# Patient Record
Sex: Female | Born: 1960 | Race: White | Hispanic: No | Marital: Single | Smoking: Former smoker
Health system: Southern US, Community
[De-identification: ages and names within clinical notes are randomized; demographics above are authoritative.]

## PROBLEM LIST (undated history)

## (undated) DIAGNOSIS — F32A Depression, unspecified: Secondary | ICD-10-CM

## (undated) DIAGNOSIS — D473 Essential (hemorrhagic) thrombocythemia: Secondary | ICD-10-CM

## (undated) DIAGNOSIS — N8003 Adenomyosis of the uterus: Secondary | ICD-10-CM

## (undated) DIAGNOSIS — I10 Essential (primary) hypertension: Secondary | ICD-10-CM

## (undated) DIAGNOSIS — G4730 Sleep apnea, unspecified: Secondary | ICD-10-CM

## (undated) DIAGNOSIS — N809 Endometriosis, unspecified: Secondary | ICD-10-CM

## (undated) DIAGNOSIS — N800 Endometriosis of uterus: Secondary | ICD-10-CM

## (undated) DIAGNOSIS — D219 Benign neoplasm of connective and other soft tissue, unspecified: Secondary | ICD-10-CM

## (undated) DIAGNOSIS — F329 Major depressive disorder, single episode, unspecified: Secondary | ICD-10-CM

## (undated) DIAGNOSIS — D75839 Thrombocytosis, unspecified: Secondary | ICD-10-CM

## (undated) HISTORY — DX: Benign neoplasm of connective and other soft tissue, unspecified: D21.9

## (undated) HISTORY — DX: Endometriosis of uterus: N80.0

## (undated) HISTORY — DX: Essential (hemorrhagic) thrombocythemia: D47.3

## (undated) HISTORY — DX: Essential (primary) hypertension: I10

## (undated) HISTORY — PX: INNER EAR SURGERY: SHX679

## (undated) HISTORY — DX: Endometriosis, unspecified: N80.9

## (undated) HISTORY — DX: Major depressive disorder, single episode, unspecified: F32.9

## (undated) HISTORY — DX: Sleep apnea, unspecified: G47.30

## (undated) HISTORY — DX: Thrombocytosis, unspecified: D75.839

## (undated) HISTORY — PX: BUNIONECTOMY: SHX129

## (undated) HISTORY — DX: Adenomyosis of the uterus: N80.03

## (undated) HISTORY — DX: Depression, unspecified: F32.A

---

## 2005-10-27 ENCOUNTER — Other Ambulatory Visit: Admission: RE | Admit: 2005-10-27 | Discharge: 2005-10-27 | Payer: Self-pay | Admitting: Obstetrics & Gynecology

## 2005-10-31 ENCOUNTER — Ambulatory Visit (HOSPITAL_COMMUNITY): Admission: RE | Admit: 2005-10-31 | Discharge: 2005-10-31 | Payer: Self-pay | Admitting: Obstetrics & Gynecology

## 2005-11-24 ENCOUNTER — Encounter: Admission: RE | Admit: 2005-11-24 | Discharge: 2005-11-24 | Payer: Self-pay | Admitting: Obstetrics & Gynecology

## 2006-01-20 HISTORY — PX: TOTAL ABDOMINAL HYSTERECTOMY: SHX209

## 2006-02-05 ENCOUNTER — Inpatient Hospital Stay (HOSPITAL_COMMUNITY): Admission: AD | Admit: 2006-02-05 | Discharge: 2006-02-07 | Payer: Self-pay | Admitting: Obstetrics & Gynecology

## 2006-02-05 ENCOUNTER — Encounter (INDEPENDENT_AMBULATORY_CARE_PROVIDER_SITE_OTHER): Payer: Self-pay | Admitting: *Deleted

## 2006-11-27 ENCOUNTER — Encounter: Admission: RE | Admit: 2006-11-27 | Discharge: 2006-11-27 | Payer: Self-pay | Admitting: Obstetrics & Gynecology

## 2008-01-01 ENCOUNTER — Encounter: Admission: RE | Admit: 2008-01-01 | Discharge: 2008-01-01 | Payer: Self-pay | Admitting: Obstetrics & Gynecology

## 2009-01-07 ENCOUNTER — Encounter: Admission: RE | Admit: 2009-01-07 | Discharge: 2009-01-07 | Payer: Self-pay | Admitting: Obstetrics & Gynecology

## 2010-03-24 ENCOUNTER — Encounter: Admission: RE | Admit: 2010-03-24 | Discharge: 2010-03-24 | Payer: Self-pay | Admitting: Obstetrics & Gynecology

## 2010-04-06 ENCOUNTER — Encounter: Admission: RE | Admit: 2010-04-06 | Discharge: 2010-04-06 | Payer: Self-pay | Admitting: Obstetrics & Gynecology

## 2010-10-07 NOTE — Discharge Summary (Signed)
Leah Sullivan, Leah Sullivan              ACCOUNT NO.:  [id number]   MEDICAL RECORD NO.:  [id number]          PATIENT TYPE:  INP   LOCATION:  9318                          FACILITY:  [facility]   PHYSICIAN:  M. Leda Quail, MD  DATE OF BIRTH:  10/11/1960   DATE OF ADMISSION:  02/05/2006  DATE OF DISCHARGE:  02/07/2006                                 DISCHARGE SUMMARY   ADMISSION DIAGNOSES:  42. A 50 year old G0 single white female with menorrhagia.  2. Adenomyosis versus fibroid uterus on ultrasound.  3. Endometrioma.  4. Possible borderline hypertension.   DISCHARGE DIAGNOSES:  1. Extensive pelvic endometriosis.  2. Probable adenomyosis.  3. Bilateral ovarian endometriomas.   PROCEDURE:  Total abdominal hysterectomy, bilateral salpingo-oophorectomy,  and extensive lysis of dense adhesions.   HOSPITAL COURSE:  The written H&P is in the chart.  In brief, Leah Sullivan  is a very pleasant 50 year old G0 single white female who has a history of  menorrhagia and on ultrasound had an enlarged uterus with a fibroid or  adenomyosis, but noted she had probable bilateral endometriomas as well.  The patient opted for definitive treatment and was admitted to same-day  surgery, where she underwent a TAH-BSO.  It was a very difficult  hysterectomy requiring approximately 4-1/2 hours due to the extensive nature  of the endometriosis.  However, during surgery the patient did do well.  She  made about 500 mL of clear urine.  She lost about 1000 mL of blood.  Her  starting hemoglobin was 13.4.  The patient after surgery was taken to the  recovery room and from there to the 2A unit.  She had a Dilaudid PCA and an  On-Q lidocaine pump in the incision for pain control.  On the evening of  postoperative day #0, she was still very groggy from her anesthesia but  arousable and coherent.  She was in good pain control.  Her vital signs were  stable.  She did have a little temperature of 99.2; however, she is a  smoker  and pulmonary PT was begun p.r.n.  The patient on the morning of  postoperative day #1 was much better.  Her vital signs were stable.  She had  no temperatures.  Her initial postoperative hemoglobin was 9.7 and her  electrolytes were stable.  She had made 2500 mL of urine output.  She had  good pain control and was tolerating liquids.  Her abdomen had good bowel  sounds.  The incision was clean, dry, and intact.  She did have an  intraperitoneal JP that was placed during the procedure.  The JP had about  80 mL the first day.  It was serosanguineous.  On her first day after  surgery she was able to be advanced to a regular diet and needed oral pain  medicines.  Her IV was discontinued.  She was up to ambulate in the hall and  she was able to void without difficulty.  By postop day #2, she had  excellent pain control.  She was voiding well and eating and ambulating  without difficulty.  Vital  signs were stable.  Postoperative day #2  hemoglobin was 9.5, which is stable.  She had about 40 mL of serosanguineous  drainage in her JP over the previous 12 hours.  Otherwise, her exam was  benign.  She had excellent bowel sounds.  Her incision again looked clean,  dry, and intact.  Her JP was removed without difficulty and the On-Q pump  tubing was removed without difficulty.  These incisions were dressed.  At  this point the patient had met all criteria for discharge and she will be  discharged to home later with her sister.   DISCHARGE INSTRUCTIONS:  These were provided in written and verbal form, and  the patient has a postop appointment to see me next week for an incision  check.  She is specifically told to call if she has worsening pain, nausea,  drainage from her incision or any fevers.  The patient is sent home with  pain medications for Percocet and Motrin.      Leah Keas, MD  Electronically Signed     MSM/MEDQ  D:  02/07/2006  T:  02/08/2006  Job:  [id number]

## 2010-10-07 NOTE — Op Note (Signed)
NAMEVENNA, Leah Sullivan              ACCOUNT NO.:  [id number]   MEDICAL RECORD NO.:  [id number]          PATIENT TYPE:  INP   LOCATION:  9318                          FACILITY:  [facility]   PHYSICIAN:  M. Leda Quail, MD  DATE OF BIRTH:  05/10/1961   DATE OF PROCEDURE:  02/05/2006  DATE OF DISCHARGE:                                 OPERATIVE REPORT   PREOPERATIVE DIAGNOSES:  57. A 49 year old gravida 0 single white female with menorrhagia.  2. Adenomyosis versus 5-cm fibroid uterus on ultrasound.  3. Left probable endometrioma.  4. Cervical mass consistent with probable prolapsed polyp.   POSTOPERATIVE DIAGNOSES:  29. A 50 year old gravida 0 single white female with an enlarged globular      uterus consistent with adenomyosis.  2. Extensive endometriosis.  3. Bilateral endometriomas on the ovaries.  4. Dense adhesions of the colon to the posterior cul-de-sac.   PROCEDURE:  TAH/BSO.  The surgery took approximately 4-and-a-half hours.   SURGEON:  M. Leda Quail, M.D.   ASSISTANT:  Edwena Felty. Romine, M.D.   ANESTHESIA:  General endotracheal.   SPECIMENS:  Uterus, cervix, ovaries, and tubes to pathology.   ESTIMATED BLOOD LOSS:  1000 mL   URINE OUTPUT:  500 mL of clear urine   FLUIDS:  3200 mL of LR   COMPLICATIONS:  None.   INDICATIONS:  A 50 year old G0 single white female with severe menorrhagia  who has undergone workup including ultrasound which showed a 5-cm region of  adenomyosis versus a poorly-defined fibroid in the fundus of the uterus.  The patient appears on ultrasound to also have bilateral endometriomas and  has been counseled therefore that she probably has endometriosis as well.  The patient does not complain of extensive pain; however, she does have  significant pain with menstrual cycles and family members feel like she has  had pain for a long time, she has just lived with it so long that she does  not even realize that she had pain.  Because of the  menorrhagia and the  cervical mass the patient was counseled about either proceeding with an exam  under anesthesia with excision of cervical mass and a hysteroscopy with D&C  or proceeding with definitive treatment.  The patient has considered options  and decided to go ahead and proceed with a TAH and possible BSO.  The  patient understood before the case started that if she had endometriosis  present on both ovaries that they would both be removed and she would be  surgically menopausal at this point.   PROCEDURE:  The patient was taken to the operating room.  She was placed in  the supine position, general endotracheal anesthesia was administered by the  anesthesia staff without difficulty.  Her legs were positioned in the frog-  leg position and an exam under anesthesia was performed.  A very smooth  cervix was palpated with an approximately 2.5-cm lesion that was felt to be  coming through the cervical os.  This did feel like a prolapsed polyp.  The  abdomen, perineum, inner thighs and vagina were prepped in a  normal sterile  fashion.  A Foley catheter was inserted under sterile conditions, the legs  were straightened, then the abdomen was draped in a normal sterile fashion.   Using a knife, a Pfannenstiel skin incision was made and carried down  through subcutaneous fat and tissue.  Cautery was used for hemostasis.  The  fascia was identified, nicked in the midline, the fascial incision was  extended laterally.  Kocher clamps were applied to the superior aspect of  the fascial incision and underlying rectus muscles were dissected off  sharply.  In a similar fashion, Kocher clamps were applied to the inferior  aspect of the fascial incision and the underlying rectus muscles were  dissected off sharply.  The rectus muscles were divided in the midline and  the peritoneum was entered superiorly.  The peritoneal incision was incised  superiorly and inferiorly with care taken to ensure  the location of the  bladder.   O'Connor-O'Sullivan retractor was placed intraabdominally.  The pelvis was  surveyed.  The uterus was densely adhesive to the posterior cul-de-sac and  did not lift up very well.  The right and left ovaries were adhered to the  pelvic side wall and to the uterus.  There did not appear to be any  endometriosis anteriorly on the uterus. Survey of the upper abdomen was  performed.  The liver edge was smooth, the gallbladder could not be  palpated.  The falciform ligament was fatty and thick and the splenic edge  was smooth.  The diaphragm edge was smooth.   The bowel was packed superiorly with three moistened laparotomy sponges and  an upper blade was applied, was attached to the O'Connor-O'Sullivan  retractor, and a curved Deaver retractor was used on this upper blade to  retract the bowel superiorly.  The bladder blade was placed inferiorly.  At  this point, long Kelly clamps were placed across the cornua of the uterus.  Attention was turned initially to the right side.  The right round ligament  was suture ligated with 0 Vicryl and then incised.  The anterior leaf of the  broad ligament was incised at the level of the internal os to the mid region  of the uterus.  The posterior leaf of the right broad ligament was then  incised parallel to the IP ligament and the ureter was identified on the  right side.  Once the ureter was identified, using careful dissection the  ovary was dissected with sharp and blunt dissection off of the right pelvic  side wall.  The ovary was also then dissected sharply from the dense  adhesions that were attaching it to the uterus.  The right ovary at this  point was much more mobile and the right IP ligament could be easily  identified.  It was isolated, doubly-clamped with Heaney clamps, transected  and suture ligated with free tie of 0 Vicryl and then a stitch tie of 0 Vicryl.  Again, the ureter was below the level of this  pedicle.  At this  point, attention was turned to the left round ligament.  This was suture  ligated and then transected.  The anterior leaf of the broad ligament on the  left side was opened and the peritoneum was incised to the level of the  internal os at the mid region of the uterus and connected to the previous  incision on the right side.  The left side had extensive adhesions of the  ovary to the uterus.  The posterior leaf of the broad ligament was opened  parallel to the IP ligament.  Initially, the IP ligament could not be  isolated because of the adhesions of the ovary.  Using both sharp and blunt  dissection, the ovary was freed from the pelvic side wall and also somewhat  from the uterus.  The left ureter was palpated and the left IP ligament was  isolated above the level of the ureter.  This pedicle was isolated and then  doubly-clamped with Heaney clamps.  The pedicle was transected and then  suture ligated with a free tie of 0 Vicryl and a stitch tie.   At this point, an attempt was made to dissect down the bladder flap.  It was  very difficult to achieve visualization because of the dense adhesions of  the uterus posteriorly as well.  I felt that the incision was not wide  enough and therefore the bladder blade and the bowel blade were removed from  the O'Connor-O'Sullivan retractor and the retractor was removed.  The skin  incisions were enlarged bilaterally, the fascial incision was enlarged  bilaterally.  The rectus muscles were dissected off further from the fascia  and this all was done to help achieve better visualization.  The Lenox Ahr retractor was placed back in the abdomen and care was taken to  ensure that the blades were not sitting on the psoas muscle and they were  not.  The bowel was packed again superiorly and the superior arm was placed  on the O'Connor-O'Sullivan retractor and the curved Deaver bowel retractor  was placed on the superior arm.   The bladder blade was inserted inferiorly  and much better visualization was noted.  At this point, the vesicouterine  peritoneum was incised off of the cervix.  The white glistening tissue of  the cervix was visualized and the bladder was advanced down the cervix away  from the area where surgeons were operating.   Attention was turned posteriorly.  Using sharp and blunt dissection the  colon was dissected carefully off of the posterior aspect of the cervix.  Once the initial dense adhesions were removed the dissection became much  easier and the posterior cul-de-sac could be easily identified by sliding  the operator's finger down the back of the cervix.  At this point the  uterine arteries were skeletonized bilaterally and clamped with curved  Heaney clamps.  These were clamped at the level of the internal os, back  clamping was performed.  These pedicles were transected and suture ligated with two sutures of 0 Vicryl.  Then, a straight Heaney clamp was placed on  each side of the cervix to begin to dissect the cardinal ligaments.  These  pedicles were transected and suture ligated with 0 Vicryl.  At this point  the uterus was amputated from the cervix to provide better visualization.  There was still a small amount of bowel that was adhesed to the most  inferior aspect of the cervix.   The cardinal ligaments were serially clamped, transected and suture ligated.  Further dissection of the pubovesicocervical fascia was performed as  necessary.  As the dissection neared the base of the cervix, attention was  turned back to the posterior aspect of the cervix and using sharp dissection  the dense tissue was dissected off the cervix.  At this point curved Heaney  clamps could be placed around the base of the cervix.  These pedicles were  transected and suture ligated  with Heaney clamps with 0 Vicryl.  At this  point the vagina was entered and the vagina was circumferentially incised at   the edge of the cervix with Jorgenson scissors.  The cervix and the cervical  mass which appeared to be a pedunculated and prolapsed cervical polyp was  handed off.  The corner angles of the cuff were stitched and incorporating  the uterosacral ligaments to lift the corner and ensure hemostasis was  present.  The remainder of the cuff was closed with figure-of-eight sutures  of 0 Vicryl.   At this point the pelvis was irrigated with copious amounts of warm normal  saline.  There were several areas of small amount of bleeding or oozing  because of the endometriosis.  One such area was posteriorly on the vagina.  A 2-0 Vicryl was used at the areas of most significant bleeding and cautery  was used in the areas where it was felt safe.  There was an area just left  of the vaginal cuff that had a small amount of bleeding.  Two superficial  figure-of-eight stitches of 2-0 Vicryl were used to obtain hemostasis.  As  well, there were two small areas of bleeding that were on the bladder.  These small bleeders were grasped with hemostats and a free tie of 0 Vicryl  was placed at the base of the hemostat to ensure excellent hemostasis.  The  vaginal cuff and cuff angles were hemostatic; however, there continued to be  some oozing tissue, particularly the posterior aspect of the vagina due to  the endometriosis.  The right ureter was again identified and the left  ureter was palpated again.  However, due to the extensive disease of  endometriosis that was present, a CT scan of the ureters will be obtained to  ensure patency and no injury.  At this point, Gelfoam was placed in front of  the cuff and behind the cuff.  A #7 JP drain was also obtained and placed in  the pelvis to watch for bleeding.  A skin incision was made below the  incision and a Kelly clamp was placed through the layers of tissue.  The  drain was placed through this incision and sutured in place with a 3-0 silk. At this point the  O'Connor-O'Sullivan retractor was removed, the laparotomy  sponges were removed, bowel and the omentum placed back in normal anatomic  position.  The peritoneum was closed with a running suture of 2-0 Vicryl.  Subfascial tissue was inspected, no bleeding was noted.  An ON-Q pump  catheter tubing was placed on the right side of the patient subfascially.  This was already primed with 1% lidocaine.  Then the fascia was closed  starting in the corners and running to the midline with a 0 Vicryl.  The  subcutaneous fat tissue was irrigated, a small amount of bleeding was noted  and hemostasis was achieved with cautery.  A subcutaneous ON-Q tubing was  placed on the left side of the patient.  This was placed in the subcutaneous  tissue.  Then the skin was closed with a subcuticular stitch of 3-0 Vicryl.  The incisions were cleansed, benzoin and Steri-Strips were applied.  Small  2x2 gauze was placed over the ON-Q pump tubing and secured in place with  Steri-Strips.  Tegaderm was placed on top of these sites to ensure that the  tubing stayed in place.   Sponge, lap, and needle instrument counts were correct x2.  The procedure  took approximately 4-and-a-half hours.  She did well during the procedure  and was extubated and taken to the recovery room in stable condition.      Lum Keas, MD  Electronically Signed     MSM/MEDQ  D:  02/05/2006  T:  02/06/2006  Job:  [id number]

## 2011-04-04 ENCOUNTER — Other Ambulatory Visit: Payer: Self-pay | Admitting: Obstetrics & Gynecology

## 2011-04-04 DIAGNOSIS — Z1231 Encounter for screening mammogram for malignant neoplasm of breast: Secondary | ICD-10-CM

## 2011-05-11 ENCOUNTER — Ambulatory Visit
Admission: RE | Admit: 2011-05-11 | Discharge: 2011-05-11 | Disposition: A | Discharge status: Home / Self Care | Payer: 59 | Source: Ambulatory Visit | Attending: Obstetrics & Gynecology | Admitting: Obstetrics & Gynecology

## 2011-05-11 DIAGNOSIS — Z1231 Encounter for screening mammogram for malignant neoplasm of breast: Secondary | ICD-10-CM

## 2012-04-02 ENCOUNTER — Other Ambulatory Visit: Payer: Self-pay | Admitting: Obstetrics & Gynecology

## 2012-04-02 DIAGNOSIS — Z1231 Encounter for screening mammogram for malignant neoplasm of breast: Secondary | ICD-10-CM

## 2012-05-20 ENCOUNTER — Ambulatory Visit
Admission: RE | Admit: 2012-05-20 | Discharge: 2012-05-20 | Disposition: A | Discharge status: Home / Self Care | Payer: 59 | Source: Ambulatory Visit | Attending: Obstetrics & Gynecology | Admitting: Obstetrics & Gynecology

## 2012-05-20 DIAGNOSIS — Z1231 Encounter for screening mammogram for malignant neoplasm of breast: Secondary | ICD-10-CM

## 2012-09-16 ENCOUNTER — Encounter: Payer: Self-pay | Admitting: Certified Nurse Midwife

## 2012-09-19 ENCOUNTER — Ambulatory Visit (INDEPENDENT_AMBULATORY_CARE_PROVIDER_SITE_OTHER): Payer: 59 | Admitting: Certified Nurse Midwife

## 2012-09-19 ENCOUNTER — Encounter: Payer: Self-pay | Admitting: Certified Nurse Midwife

## 2012-09-19 VITALS — BP 116/68 | Ht 68.25 in | Wt 278.0 lb

## 2012-09-19 DIAGNOSIS — Z23 Encounter for immunization: Secondary | ICD-10-CM

## 2012-09-19 DIAGNOSIS — Z0000 Encounter for general adult medical examination without abnormal findings: Secondary | ICD-10-CM

## 2012-09-19 DIAGNOSIS — Z01419 Encounter for gynecological examination (general) (routine) without abnormal findings: Secondary | ICD-10-CM

## 2012-09-19 LAB — POCT URINALYSIS DIPSTICK
Clarity, UA: NEGATIVE
Color, UA: NEGATIVE
Leukocytes, UA: NEGATIVE
Nitrite, UA: NEGATIVE
Protein, UA: NEGATIVE
Urobilinogen, UA: NEGATIVE

## 2012-09-19 NOTE — Patient Instructions (Signed)

## 2012-09-19 NOTE — Progress Notes (Signed)
53 y.o. G0P0000 Single Caucasian Fe here for annual exam. Menopausal on HRT, working well.  Denies vaginal bleeding or dryness.  Prozac working well for depression desires continuance.  Sees PCP for aex, medication management and all labs.  No health issues today     Patient's last menstrual period was 01/20/2006.          Sexually active: no  The current method of family planning is status post hysterectomy.    Exercising: yes  walking Smoker:  no  Health Maintenance: Pap:  10-27-05 neg MMG:  04-3012 neg Colonoscopy:  none BMD:   none TDaP:  unknown Labs: not done   reports that she has quit smoking. She does not have any smokeless tobacco history on file. She reports that she drinks about 0.5 ounces of alcohol per week. She reports that she does not use illicit drugs.  Past Medical History  Diagnosis Date  . Fibroid   . Depression     mild  . Endometriosis   . Adenomyosis   . Hypertension     Past Surgical History  Procedure Laterality Date  . Bunionectomy    . Total abdominal hysterectomy  9/07  . Inner ear surgery Left     hole in eardrum    Current Outpatient Prescriptions  Medication Sig Dispense Refill  . estradiol-norethindrone (ACTIVELLA) 1-0.5 MG per tablet Take 2 tablets by mouth daily.       Marland Kitchen FLUoxetine (PROZAC) 20 MG capsule Take 20 mg by mouth daily.      . hydrochlorothiazide (HYDRODIURIL) 25 MG tablet Take 25 mg by mouth daily.      Marland Kitchen losartan (COZAAR) 25 MG tablet Take 25 mg by mouth daily.      . Multiple Vitamins-Minerals (MULTIVITAMIN PO) Take by mouth daily.       No current facility-administered medications for this visit.    Family History  Problem Relation Age of Onset  . Hypertension Mother   . Breast cancer Maternal Aunt   . Breast cancer Maternal Grandmother   . Breast cancer Maternal Aunt     ROS:  Pertinent items are noted in HPI.  Otherwise, a comprehensive ROS was negative.  Exam:   BP 116/68  Ht 5' 8.25" (1.734 m)  Wt 278 lb  (126.1 kg)  BMI 41.94 kg/m2  LMP 01/20/2006 Height: 5' 8.25" (173.4 cm)  Ht Readings from Last 3 Encounters:  09/19/12 5' 8.25" (1.734 m)    General appearance: alert, cooperative and appears stated age Head: Normocephalic, without obvious abnormality, atraumatic Neck: no adenopathy, supple, symmetrical, trachea midline and thyroid normal to inspection and palpation Lungs: clear to auscultation bilaterally Breasts: normal appearance, no masses or tenderness, No nipple discharge or bleeding Heart: regular rate and rhythm Abdomen: soft, non-tender; no masses,  no organomegaly Extremities: extremities normal, atraumatic, no cyanosis or edema Skin: Skin color, texture, turgor normal. No rashes or lesions Lymph nodes: Cervical, supraclavicular, and axillary nodes normal. No abnormal inguinal nodes palpated Neurologic: Grossly normal   Pelvic: External genitalia:  no lesions              Urethra:  normal appearing urethra with no masses, tenderness or lesions              Bartholin's and Skene's: normal                 Vagina: normal appearing vagina with normal color and discharge, no lesions  Cervix: absent              Pap taken: no Bimanual Exam:  Uterus:  uterus absent              Adnexa: absent               Rectovaginal: Confirms               Anus:  normal sphincter tone, no lesions  A:  Well Woman with normal exam  Reviewed health and wellness pertinent to exam  Depression medication working well  Menopausal, HRT working well desires continuance  Immunization update  Strong family history of breast cancer, desires no genetic screening or information, aware of what is available.   P:   Pap smear as per guidelines   Mammogram yearly SBE stressed  RX Prozac see order  Rx  HRT see order  Requests TDAP    return annually or prn  An After Visit Summary was printed and given to the patient.  Reviewed, TL

## 2012-09-23 ENCOUNTER — Telehealth: Payer: Self-pay | Admitting: Certified Nurse Midwife

## 2012-09-23 MED ORDER — ESTRADIOL-NORETHINDRONE ACET 1-0.5 MG PO TABS: 2.0000 | ORAL_TABLET | Freq: Every day | ORAL | Status: DC

## 2012-09-23 NOTE — Telephone Encounter (Signed)
Rx sent in for one year. Pt is aware.

## 2012-09-23 NOTE — Telephone Encounter (Signed)
Ok to refill I do not see where it went in.

## 2012-09-23 NOTE — Telephone Encounter (Signed)
PT . REQUEST ESTRADIAL REFILLS, [facility] [phone]

## 2012-09-23 NOTE — Telephone Encounter (Signed)
Leah Sullivan, pt seen in office on 09/16/2012 for aex. I don't see where Rx for Activella was refilled. Please advise.

## 2012-09-23 NOTE — Telephone Encounter (Signed)
Rx for Activella BID x1 year approved in system. Pt is aware.

## 2012-09-27 ENCOUNTER — Telehealth: Payer: Self-pay | Admitting: *Deleted

## 2012-09-27 MED ORDER — ESTRADIOL-NORETHINDRONE ACET 1-0.5 MG PO TABS: 2.0000 | ORAL_TABLET | Freq: Every day | ORAL | Status: DC

## 2012-09-27 NOTE — Telephone Encounter (Signed)
Pt states that Rx for Activella she was given on 09/23/2012 has not arrived through mail order as of today. Pt states she does not have enough to make through the weekend. One months worth sent to [facility]. DL- Please refer to Rx refill sent to your in-basket.

## 2012-09-27 NOTE — Telephone Encounter (Signed)
Pt was given this RX x1 year on 09/23/2012. Pt states her mail order (Activella) has not arrived yet and she would not have enough to get through the weekend. One months worth (Activella 1-0.5 1po BID #60/0 refills) was sent to [facility] (per pt) to maintain until mail order arrives.

## 2012-09-27 NOTE — Telephone Encounter (Signed)
agree

## 2012-11-28 ENCOUNTER — Other Ambulatory Visit: Payer: Self-pay | Admitting: Obstetrics & Gynecology

## 2012-11-28 NOTE — Telephone Encounter (Signed)
eScribe request for refill on FLUOXETINE Last filled - 09/20/11 X 1 YEAR Last AEX - 09/19/12 Next AEX - 09/25/13 Please advise refills.  Chart on Leah Sullivan's shelf.

## 2013-05-16 ENCOUNTER — Telehealth: Payer: Self-pay | Admitting: Internal Medicine

## 2013-05-16 NOTE — Telephone Encounter (Signed)
LVOM FOR MEDICAL RECORDS AT DR. Victorino Dike [facility]OWN OFFICE TO FAX LABS OVER. REFERRAL REC'D BUT NO LABS.

## 2013-05-29 ENCOUNTER — Telehealth: Payer: Self-pay | Admitting: Internal Medicine

## 2013-05-29 NOTE — Telephone Encounter (Signed)
C/D 05/29/13 for appt. 06/13/13

## 2013-05-29 NOTE — Telephone Encounter (Signed)
S/W PT AND GVE NP APPT 01/23 @ 1:30 W/DR. CHISM REFERRING DR. [place] PACKET MAILED.

## 2013-06-06 ENCOUNTER — Other Ambulatory Visit: Payer: Self-pay

## 2013-06-06 DIAGNOSIS — Z1231 Encounter for screening mammogram for malignant neoplasm of breast: Secondary | ICD-10-CM

## 2013-06-13 ENCOUNTER — Other Ambulatory Visit: Payer: Self-pay | Admitting: Internal Medicine

## 2013-06-13 ENCOUNTER — Other Ambulatory Visit (HOSPITAL_BASED_OUTPATIENT_CLINIC_OR_DEPARTMENT_OTHER): Payer: No Typology Code available for payment source

## 2013-06-13 ENCOUNTER — Ambulatory Visit (HOSPITAL_BASED_OUTPATIENT_CLINIC_OR_DEPARTMENT_OTHER): Payer: No Typology Code available for payment source | Admitting: Internal Medicine

## 2013-06-13 ENCOUNTER — Telehealth: Payer: Self-pay | Admitting: Internal Medicine

## 2013-06-13 ENCOUNTER — Ambulatory Visit: Payer: No Typology Code available for payment source

## 2013-06-13 ENCOUNTER — Encounter: Payer: Self-pay | Admitting: Internal Medicine

## 2013-06-13 VITALS — BP 136/74 | HR 69 | Temp 98.1°F | Resp 19 | Ht 68.25 in | Wt 236.4 lb

## 2013-06-13 DIAGNOSIS — D75839 Thrombocytosis, unspecified: Secondary | ICD-10-CM

## 2013-06-13 DIAGNOSIS — D473 Essential (hemorrhagic) thrombocythemia: Secondary | ICD-10-CM

## 2013-06-13 DIAGNOSIS — E876 Hypokalemia: Secondary | ICD-10-CM

## 2013-06-13 LAB — CBC WITH DIFFERENTIAL/PLATELET
BASO%: 1 % (ref 0.0–2.0)
BASOS ABS: 0.1 10*3/uL (ref 0.0–0.1)
EOS ABS: 0.2 10*3/uL (ref 0.0–0.5)
EOS%: 1.9 % (ref 0.0–7.0)
HEMATOCRIT: 38.4 % (ref 34.8–46.6)
HEMOGLOBIN: 12.8 g/dL (ref 11.6–15.9)
LYMPH#: 2 10*3/uL (ref 0.9–3.3)
LYMPH%: 19 % (ref 14.0–49.7)
MCH: 28.2 pg (ref 25.1–34.0)
MCHC: 33.4 g/dL (ref 31.5–36.0)
MCV: 84.2 fL (ref 79.5–101.0)
MONO#: 0.7 10*3/uL (ref 0.1–0.9)
MONO%: 6.7 % (ref 0.0–14.0)
NEUT%: 71.4 % (ref 38.4–76.8)
NEUTROS ABS: 7.4 10*3/uL — AB (ref 1.5–6.5)
Platelets: 519 10*3/uL — ABNORMAL HIGH (ref 145–400)
RBC: 4.56 10*6/uL (ref 3.70–5.45)
RDW: 14.6 % — AB (ref 11.2–14.5)
WBC: 10.4 10*3/uL — ABNORMAL HIGH (ref 3.9–10.3)

## 2013-06-13 LAB — COMPREHENSIVE METABOLIC PANEL (CC13)
ALBUMIN: 3.8 g/dL (ref 3.5–5.0)
ALK PHOS: 72 U/L (ref 40–150)
ALT: 12 U/L (ref 0–55)
ANION GAP: 9 meq/L (ref 3–11)
AST: 14 U/L (ref 5–34)
BUN: 11.1 mg/dL (ref 7.0–26.0)
CO2: 30 meq/L — AB (ref 22–29)
Calcium: 9.7 mg/dL (ref 8.4–10.4)
Chloride: 102 mEq/L (ref 98–109)
Creatinine: 0.9 mg/dL (ref 0.6–1.1)
GLUCOSE: 106 mg/dL (ref 70–140)
POTASSIUM: 3.4 meq/L — AB (ref 3.5–5.1)
SODIUM: 140 meq/L (ref 136–145)
TOTAL PROTEIN: 6.9 g/dL (ref 6.4–8.3)
Total Bilirubin: 0.24 mg/dL (ref 0.20–1.20)

## 2013-06-13 LAB — CHCC SMEAR

## 2013-06-13 NOTE — Progress Notes (Signed)
Checked in new pt with no financial concerns. °

## 2013-06-13 NOTE — Telephone Encounter (Signed)
gv pt appt schedule for jan/feb. °

## 2013-06-13 NOTE — Patient Instructions (Signed)
Hypokalemia Hypokalemia means that the amount of potassium in the blood is lower than normal.Potassium is a chemical, called an electrolyte, that helps regulate the amount of fluid in the body. It also stimulates muscle contraction and helps nerves function properly.Most of the body's potassium is inside of cells, and only a very small amount is in the blood. Because the amount in the blood is so small, minor changes can be life-threatening. CAUSES  Antibiotics.  Diarrhea or vomiting.  Using laxatives too much, which can cause diarrhea.  Chronic kidney disease.  Water pills (diuretics).  Eating disorders (bulimia).  Low magnesium level.  Sweating a lot. SIGNS AND SYMPTOMS  Weakness.  Constipation.  Fatigue.  Muscle cramps.  Mental confusion.  Skipped heartbeats or irregular heartbeat (palpitations).  Tingling or numbness. DIAGNOSIS  Your health care provider can diagnose hypokalemia with blood tests. In addition to checking your potassium level, your health care provider may also check other lab tests. TREATMENT Hypokalemia can be treated with potassium supplements taken by mouth or adjustments in your current medicines. If your potassium level is very low, you may need to get potassium through a vein (IV) and be monitored in the hospital. A diet high in potassium is also helpful. Foods high in potassium are:  Nuts, such as peanuts and pistachios.  Seeds, such as sunflower seeds and pumpkin seeds.  Peas, lentils, and lima beans.  Whole grain and bran cereals and breads.  Fresh fruit and vegetables, such as apricots, avocado, bananas, cantaloupe, kiwi, oranges, tomatoes, asparagus, and potatoes.  Orange and tomato juices.  Red meats.  Fruit yogurt. HOME CARE INSTRUCTIONS  Take all medicines as prescribed by your health care provider.  Maintain a healthy diet by including nutritious food, such as fruits, vegetables, nuts, whole grains, and lean meats.  If  you are taking a laxative, be sure to follow the directions on the label. SEEK MEDICAL CARE IF:  Your weakness gets worse.  You feel your heart pounding or racing.  You are vomiting or having diarrhea.  You are diabetic and having trouble keeping your blood glucose in the normal range. SEEK IMMEDIATE MEDICAL CARE IF:  You have chest pain, shortness of breath, or dizziness.  You are vomiting or having diarrhea for more than 2 days.  You faint. MAKE SURE YOU:   Understand these instructions.  Will watch your condition.  Will get help right away if you are not doing well or get worse. Document Released: 05/08/2005 Document Revised: 02/26/2013 Document Reviewed: 11/08/2012 ExitCare Patient Information 2014 [employer].  

## 2013-06-16 DIAGNOSIS — D473 Essential (hemorrhagic) thrombocythemia: Secondary | ICD-10-CM | POA: Insufficient documentation

## 2013-06-16 DIAGNOSIS — D75839 Thrombocytosis, unspecified: Secondary | ICD-10-CM | POA: Insufficient documentation

## 2013-06-16 NOTE — Progress Notes (Signed)
[facility] [phone]   [phone]  NEW PATIENT EVALUATION   Name: Leah Sullivan Date: 06/16/2013 MRN: [id number] DOB: [birth year]  PCP: Eloise Levels REFERRING PHYSICIAN: Eloise Levels  REASON FOR REFERRAL: Thrombocytosis   HISTORY OF PRESENT ILLNESS:Leah Sullivan is a 53 y.o. female who is being evaluated for thrombocytosis.  She was last seen by Dr. Owens Shark on 05/13/2013 and due to persistence in her thrombocytosis she was referred to our office.   Of note, she also has a past medical history of hypertension, hyperlipidemia, depression  And obesity.  She reports that in early adulthood that her counts were elevated and that they were observed.  On 05/02/2013, her CRP was high at 8.3; Ferritin was 16.5; WBC were 9.8; Hgb of 12.9; MCV of 83.5; Plt of 537.  ON 12/30/2012, her WBC was 13.4; Hgb of 12.8.  MCV of 84.7; Plt 572.    She reports occasional headaches.  She has lost 42 lbs over the last several months intentionally.  She complains of occasional plantar fasciitis.  She denies itching following her showers.  She denies a history of CVAs, MIs or blood clots.  Her sister had a provoked blood clot in her R Lower extremity.  Otherwise, she denies a family history of clots.  She denies N/V/D.  She denies  Smoking history.  PAST MEDICAL HISTORY:  has a past medical history of Fibroid; Depression; Endometriosis; Adenomyosis; and Hypertension.     PAST SURGICAL HISTORY: Past Surgical History  Procedure Laterality Date  . Bunionectomy    . Total abdominal hysterectomy  9/07  . Inner ear surgery Left     hole in eardrum     CURRENT MEDICATIONS: has a current medication list which includes the following prescription(s): estradiol-norethindrone, fluoxetine, hydrochlorothiazide, losartan, and multiple vitamins-minerals.   ALLERGIES: Review of patient's allergies indicates no known allergies.   SOCIAL HISTORY:  reports that she has quit  smoking. She does not have any smokeless tobacco history on file. She reports that she drinks about 0.5 ounces of alcohol per week. She reports that she does not use illicit drugs.   FAMILY HISTORY: family history includes Breast cancer in her maternal aunt, maternal aunt, and maternal grandmother; Hypertension in her mother.   LABORATORY DATA:  CBC    Component Value Date/Time   WBC 10.4* 06/13/2013 1406   RBC 4.56 06/13/2013 1406   HGB 12.8 06/13/2013 1406   HCT 38.4 06/13/2013 1406   PLT 519* 06/13/2013 1406   MCV 84.2 06/13/2013 1406   MCH 28.2 06/13/2013 1406   MCHC 33.4 06/13/2013 1406   RDW 14.6* 06/13/2013 1406   LYMPHSABS 2.0 06/13/2013 1406   MONOABS 0.7 06/13/2013 1406   EOSABS 0.2 06/13/2013 1406   BASOSABS 0.1 06/13/2013 1406    CMP     Component Value Date/Time   NA 140 06/13/2013 1406   K 3.4* 06/13/2013 1406   CO2 30* 06/13/2013 1406   GLUCOSE 106 06/13/2013 1406   BUN 11.1 06/13/2013 1406   CREATININE 0.9 06/13/2013 1406   CALCIUM 9.7 06/13/2013 1406   PROT 6.9 06/13/2013 1406   ALBUMIN 3.8 06/13/2013 1406   AST 14 06/13/2013 1406   ALT 12 06/13/2013 1406   ALKPHOS 72 06/13/2013 1406   BILITOT 0.24 06/13/2013 1406    RADIOGRAPHY: No results found.     REVIEW OF SYSTEMS:  Constitutional: Denies fevers, chills or abnormal weight loss Eyes: Denies blurriness of vision Ears, nose, mouth, throat,  and face: Denies mucositis or sore throat Respiratory: Denies cough, dyspnea or wheezes Cardiovascular: Denies palpitation, chest discomfort or lower extremity swelling Gastrointestinal:  Denies nausea, heartburn or change in bowel habits Skin: Denies abnormal skin rashes Lymphatics: Denies new lymphadenopathy or easy bruising Neurological:Denies numbness, tingling or new weaknesses Behavioral/Psych: Mood is stable, no new changes  All other systems were reviewed with the patient and are negative.  PHYSICAL EXAM:  height is 5' 8.25" (1.734 m) and weight is 236 lb 6.4 oz (107.23  kg). Her oral temperature is 98.1 F (36.7 C). Her blood pressure is 136/74 and her pulse is 69. Her respiration is 19 and oxygen saturation is 98%.    GENERAL:alert, no distress and comfortable, obese, well developed SKIN: skin color, texture, turgor are normal, no rashes or significant lesions EYES: normal, Conjunctiva are pink and non-injected, sclera clear OROPHARYNX:no exudate, no erythema and lips, buccal mucosa, and tongue normal  NECK: supple, thyroid normal size, non-tender, without nodularity LYMPH:  no palpable lymphadenopathy in the cervical, axillary or inguinal LUNGS: clear to auscultation and percussion with normal breathing effort HEART: regular rate & rhythm and no murmurs and no lower extremity edema ABDOMEN:abdomen soft, non-tender and normal bowel sounds Musculoskeletal:no cyanosis of digits and no clubbing  NEURO: alert & oriented x 3 with fluent speech, no focal motor/sensory deficits   IMPRESSION: Leah Sullivan is a 53 y.o. female with a history of mild thrombocytosis.   PLAN: 1.  Thrombocytosis NOS. --Given her persistence in thrombocytosis, we will rule out a primary myeloproliferative disorder, i.e., essential thrombocythemia.  We can obtain additional CBCs from PCP within the last few months to further establish chronicity.  If persistence in thrombocytosis with exclusion of secondary cause, may require bone marrow biopsy and abdominal imaging work-up to rule out enlarged spleen.   --We will check for JAK2 and possibly a bone marrow biopsy if trending up, to exclude essential thrombocytosis. Given its elevation mild elevation, we will base starting medications for this based on trend of labs.    --We had an extensive discussion on the risks of elevated plts including a risk of clots and bleeding. We will also check a vitamin B12, TSH and Erythropoietin levels.  2. Follow-up. --Repeat labs and follow up in one month to discuss the results.   All questions  were answered. The patient knows to call the clinic with any problems, questions or concerns. We can certainly see the patient much sooner if necessary.  I spent 25 minutes counseling the patient face to face. The total time spent in the appointment was 45 minutes.    Tyreonna Czaplicki, MD 06/16/2013 5:39 AM

## 2013-06-20 ENCOUNTER — Telehealth: Payer: Self-pay | Admitting: Medical Oncology

## 2013-06-20 ENCOUNTER — Other Ambulatory Visit (HOSPITAL_BASED_OUTPATIENT_CLINIC_OR_DEPARTMENT_OTHER): Payer: No Typology Code available for payment source

## 2013-06-20 DIAGNOSIS — D473 Essential (hemorrhagic) thrombocythemia: Secondary | ICD-10-CM

## 2013-06-20 DIAGNOSIS — D75839 Thrombocytosis, unspecified: Secondary | ICD-10-CM

## 2013-06-20 DIAGNOSIS — E876 Hypokalemia: Secondary | ICD-10-CM

## 2013-06-20 LAB — CBC WITH DIFFERENTIAL/PLATELET
BASO%: 0.9 % (ref 0.0–2.0)
BASOS ABS: 0.1 10*3/uL (ref 0.0–0.1)
EOS%: 1.7 % (ref 0.0–7.0)
Eosinophils Absolute: 0.2 10*3/uL (ref 0.0–0.5)
HEMATOCRIT: 39.8 % (ref 34.8–46.6)
HEMOGLOBIN: 13.1 g/dL (ref 11.6–15.9)
LYMPH#: 2.2 10*3/uL (ref 0.9–3.3)
LYMPH%: 19.6 % (ref 14.0–49.7)
MCH: 27.9 pg (ref 25.1–34.0)
MCHC: 32.9 g/dL (ref 31.5–36.0)
MCV: 84.7 fL (ref 79.5–101.0)
MONO#: 0.5 10*3/uL (ref 0.1–0.9)
MONO%: 4.7 % (ref 0.0–14.0)
NEUT%: 73.1 % (ref 38.4–76.8)
NEUTROS ABS: 8 10*3/uL — AB (ref 1.5–6.5)
PLATELETS: 535 10*3/uL — AB (ref 145–400)
RBC: 4.7 10*6/uL (ref 3.70–5.45)
RDW: 14.3 % (ref 11.2–14.5)
WBC: 11 10*3/uL — AB (ref 3.9–10.3)

## 2013-06-20 LAB — BASIC METABOLIC PANEL (CC13)
ANION GAP: 11 meq/L (ref 3–11)
BUN: 10.7 mg/dL (ref 7.0–26.0)
CO2: 27 mEq/L (ref 22–29)
Calcium: 10.4 mg/dL (ref 8.4–10.4)
Chloride: 104 mEq/L (ref 98–109)
Creatinine: 0.8 mg/dL (ref 0.6–1.1)
Glucose: 111 mg/dl (ref 70–140)
POTASSIUM: 3.5 meq/L (ref 3.5–5.1)
SODIUM: 142 meq/L (ref 136–145)

## 2013-06-20 LAB — TSH CHCC: TSH: 1.279 m[IU]/L (ref 0.308–3.960)

## 2013-06-20 NOTE — Telephone Encounter (Signed)
Erline Levine with [facility] called and left a message stating that pt informed them Dr. Juliann Mule needs more labs. She is not sure what he is asking for. I called her back and left a message that he would like to get more CBC from past months or the last couple of years. I asked her to fax to our office.

## 2013-06-23 ENCOUNTER — Ambulatory Visit
Admission: RE | Admit: 2013-06-23 | Discharge: 2013-06-23 | Disposition: A | Discharge status: Home / Self Care | Payer: Self-pay | Source: Ambulatory Visit

## 2013-06-23 DIAGNOSIS — Z1231 Encounter for screening mammogram for malignant neoplasm of breast: Secondary | ICD-10-CM

## 2013-06-23 LAB — VITAMIN B12: Vitamin B-12: 477 pg/mL (ref 211–911)

## 2013-06-23 LAB — ERYTHROPOIETIN: Erythropoietin: 6.9 m[IU]/mL (ref 2.6–18.5)

## 2013-07-11 ENCOUNTER — Other Ambulatory Visit (HOSPITAL_BASED_OUTPATIENT_CLINIC_OR_DEPARTMENT_OTHER): Payer: No Typology Code available for payment source

## 2013-07-11 ENCOUNTER — Ambulatory Visit (HOSPITAL_BASED_OUTPATIENT_CLINIC_OR_DEPARTMENT_OTHER): Payer: No Typology Code available for payment source | Admitting: Internal Medicine

## 2013-07-11 ENCOUNTER — Telehealth: Payer: Self-pay | Admitting: Internal Medicine

## 2013-07-11 VITALS — BP 140/75 | HR 71 | Temp 97.8°F | Resp 18 | Ht 68.0 in | Wt 232.6 lb

## 2013-07-11 DIAGNOSIS — D75839 Thrombocytosis, unspecified: Secondary | ICD-10-CM

## 2013-07-11 DIAGNOSIS — F329 Major depressive disorder, single episode, unspecified: Secondary | ICD-10-CM

## 2013-07-11 DIAGNOSIS — I10 Essential (primary) hypertension: Secondary | ICD-10-CM

## 2013-07-11 DIAGNOSIS — F3289 Other specified depressive episodes: Secondary | ICD-10-CM

## 2013-07-11 DIAGNOSIS — D473 Essential (hemorrhagic) thrombocythemia: Secondary | ICD-10-CM

## 2013-07-11 DIAGNOSIS — E669 Obesity, unspecified: Secondary | ICD-10-CM

## 2013-07-11 LAB — CBC WITH DIFFERENTIAL/PLATELET
BASO%: 1.3 % (ref 0.0–2.0)
Basophils Absolute: 0.1 10*3/uL (ref 0.0–0.1)
EOS%: 2.6 % (ref 0.0–7.0)
Eosinophils Absolute: 0.2 10*3/uL (ref 0.0–0.5)
HCT: 38.4 % (ref 34.8–46.6)
HGB: 12.6 g/dL (ref 11.6–15.9)
LYMPH%: 23.4 % (ref 14.0–49.7)
MCH: 28.1 pg (ref 25.1–34.0)
MCHC: 32.9 g/dL (ref 31.5–36.0)
MCV: 85.5 fL (ref 79.5–101.0)
MONO#: 0.6 10*3/uL (ref 0.1–0.9)
MONO%: 6.6 % (ref 0.0–14.0)
NEUT#: 6.2 10*3/uL (ref 1.5–6.5)
NEUT%: 66.1 % (ref 38.4–76.8)
PLATELETS: 525 10*3/uL — AB (ref 145–400)
RBC: 4.49 10*6/uL (ref 3.70–5.45)
RDW: 14.4 % (ref 11.2–14.5)
WBC: 9.3 10*3/uL (ref 3.9–10.3)
lymph#: 2.2 10*3/uL (ref 0.9–3.3)

## 2013-07-11 NOTE — Telephone Encounter (Signed)
gv and printed appt sched and avs forpt for April and June

## 2013-07-13 NOTE — Progress Notes (Signed)
[facility] OFFICE PROGRESS NOTE  No PCP Per Patient [place] [address]  DIAGNOSIS: Thrombocytosis - Plan: ANA, C-reactive protein, CBC with Differential, CBC with Differential, Sedimentation rate  Chief Complaint  Patient presents with  . Thrombocytosis    CURRENT TREATMENT: Observation.  INTERVAL HISTORY: Leah Sullivan 53 y.o. female with a history of thrombocytosis is here for follow-up.  She was last seen by [place] on 06/13/2013.    She was last seen by Dr. Owens Shark on 05/13/2013 and due to persistence in her thrombocytosis she was referred to our office. Of note, she also has a past medical history of hypertension, hyperlipidemia, depression and obesity. She reports that in early adulthood that her counts were elevated and that they were observed. On 05/02/2013, her CRP was high at 8.3; Ferritin was 16.5; WBC were 9.8; Hgb of 12.9; MCV of 83.5; Plt of 537. ON 12/30/2012, her WBC was 13.4; Hgb of 12.8. MCV of 84.7; Plt 572.   She reports occasional headaches. She has lost 42 lbs over the last several months intentionally. She complains of occasional plantar fasciitis. She denies itching following her showers. She denies a history of CVAs, MIs or blood clots. Her sister had a provoked blood clot in her R Lower extremity. Otherwise, she denies a family history of clots. She denies N/V/D. She denies smoking history.  MEDICAL HISTORY: Past Medical History  Diagnosis Date  . Fibroid   . Depression     mild  . Endometriosis   . Adenomyosis   . Hypertension     INTERIM HISTORY: has Thrombocytosis on her problem list.    ALLERGIES:  has No Known Allergies.  MEDICATIONS: has a current medication list which includes the following prescription(s): estradiol-norethindrone, fluoxetine, hydrochlorothiazide, losartan, and multiple vitamins-minerals.  SURGICAL HISTORY:  Past Surgical History  Procedure Laterality Date  . Bunionectomy    . Total abdominal  hysterectomy  9/07  . Inner ear surgery Left     hole in eardrum    REVIEW OF SYSTEMS:   Constitutional: Denies fevers, chills or abnormal weight loss Eyes: Denies blurriness of vision Ears, nose, mouth, throat, and face: Denies mucositis or sore throat Respiratory: Denies cough, dyspnea or wheezes Cardiovascular: Denies palpitation, chest discomfort or lower extremity swelling Gastrointestinal:  Denies nausea, heartburn or change in bowel habits Skin: Denies abnormal skin rashes Lymphatics: Denies new lymphadenopathy or easy bruising Neurological:Denies numbness, tingling or new weaknesses Behavioral/Psych: Mood is stable, no new changes  All other systems were reviewed with the patient and are negative.  PHYSICAL EXAMINATION: ECOG PERFORMANCE STATUS: 0 - Asymptomatic  Blood pressure 140/75, pulse 71, temperature 97.8 F (36.6 C), temperature source Oral, resp. rate 18, height 5\' 8"  (1.727 m), weight 232 lb 9.6 oz (105.507 kg), last menstrual period 01/20/2006, SpO2 97.00%.  GENERAL:alert, no distress and comfortable; obese, well developed and well nourished.  SKIN: skin color, texture, turgor are normal, no rashes or significant lesions EYES: normal, Conjunctiva are pink and non-injected, sclera clear OROPHARYNX:no exudate, no erythema and lips, buccal mucosa, and tongue normal  NECK: supple, thyroid normal size, non-tender, without nodularity LYMPH:  no palpable lymphadenopathy in the cervical, axillary or supraclavicular LUNGS: clear to auscultation with normal breathing effort, no wheezes or rhonchi HEART: regular rate & rhythm and no murmurs and no lower extremity edema ABDOMEN:abdomen soft, non-tender and normal bowel sounds Musculoskeletal:no cyanosis of digits and no clubbing  NEURO: alert & oriented x 3 with fluent speech, no focal  motor/sensory deficits  Labs:  Lab Results  Component Value Date   WBC 9.3 07/11/2013   HGB 12.6 07/11/2013   HCT 38.4 07/11/2013   MCV  85.5 07/11/2013   PLT 525* 07/11/2013   NEUTROABS 6.2 07/11/2013      Chemistry      Component Value Date/Time   NA 142 06/20/2013 1358   K 3.5 06/20/2013 1358   CO2 27 06/20/2013 1358   BUN 10.7 06/20/2013 1358   CREATININE 0.8 06/20/2013 1358      Component Value Date/Time   CALCIUM 10.4 06/20/2013 1358   ALKPHOS 72 06/13/2013 1406   AST 14 06/13/2013 1406   ALT 12 06/13/2013 1406   BILITOT 0.24 06/13/2013 1406       CBC:  Recent Labs Lab 07/11/13 1435  WBC 9.3  NEUTROABS 6.2  HGB 12.6  HCT 38.4  MCV 85.5  PLT 525*   Studies:  No results found.   RADIOGRAPHIC STUDIES: Mm Digital Screening  06/23/2013   CLINICAL DATA:  Screening.  EXAM: DIGITAL SCREENING BILATERAL MAMMOGRAM WITH CAD  COMPARISON:  Previous exam(s).  ACR Breast Density Category c: The breast tissue is heterogeneously dense, which may obscure small masses.  FINDINGS: There are no findings suspicious for malignancy. Images were processed with CAD.  IMPRESSION: No mammographic evidence of malignancy. A result letter of this screening mammogram will be mailed directly to the patient.  RECOMMENDATION: Screening mammogram in one year. (Code:SM-B-01Y)  BI-RADS CATEGORY  1: Negative.   Electronically Signed   By: Lovey Newcomer M.D.   On: 06/23/2013 16:48    ASSESSMENT: Leah Sullivan 53 y.o. female with a history of Thrombocytosis - Plan: ANA, C-reactive protein, CBC with Differential, CBC with Differential, Sedimentation rate   PLAN:  Thrombocytosis, likely reactive.  --Given her persistence in thrombocytosis, we will rule out a primary myeloproliferative disorder, i.e., essential thrombocythemia. Her JAK2 was negative; EP levels were within normal limits.  Vitamin B 12 was also normal. TSH was within normal limits. This points away from ET.  Her plts are 525 today down from 535.  Her WBC and hemoglobin are within normal limits.  She did have an elevated CRP which is often up in the context of reactive CRP.  We discussed  possible abdominal ultrasound to help further exclude other bone marrow disorders, she requested to observe for now.    --We had an extensive discussion on the risks of elevated plts including a risk of clots and bleeding.  2. Follow-up.  --Repeat labs in 2 months and follow up in four months.    All questions were answered. The patient knows to call the clinic with any problems, questions or concerns. We can certainly see the patient much sooner if necessary.  I spent 10 minutes counseling the patient face to face. The total time spent in the appointment was 15 minutes.    Churchill Grimsley, MD 07/13/2013 12:55 PM

## 2013-08-14 ENCOUNTER — Other Ambulatory Visit: Payer: Self-pay | Admitting: Obstetrics & Gynecology

## 2013-08-14 NOTE — Telephone Encounter (Signed)
Pt needs a Rx for [facility] Phone number is [phone]  fax  [phone]

## 2013-08-14 NOTE — Telephone Encounter (Signed)
Last AEX 09/19/2012 [place] refill 09/27/2012 #60/0 refills Next appt 09/25/2013  Please approve or deny Rx.

## 2013-08-15 MED ORDER — ESTRADIOL-NORETHINDRONE ACET 1-0.5 MG PO TABS: 2.0000 | ORAL_TABLET | Freq: Every day | ORAL | Status: DC

## 2013-08-15 NOTE — Telephone Encounter (Signed)
Patient notified

## 2013-08-20 ENCOUNTER — Telehealth: Payer: Self-pay | Admitting: Certified Nurse Midwife

## 2013-08-20 NOTE — Telephone Encounter (Signed)
Spoke with patient. Advised had talked to [facility] and we are currently awaiting form to be faxed over to fill out and send back. Will call patient back with information on approval or denial from [facility] as soon as possible. Patient agreeable and verbalizes understanding.

## 2013-08-20 NOTE — Telephone Encounter (Signed)
[facility] calling re: Activella, RX is for two a day but [employer] only allows one a day.  For prior authorization, if necessary, call: [phone]

## 2013-08-20 NOTE — Telephone Encounter (Signed)
Patient is having a problem getting her prescription from [facility]. Please call patient, she is almost out of her prescription. (see previous notes)

## 2013-08-20 NOTE — Telephone Encounter (Signed)
Spoke with [employer]. Form for quantity over ride to be faxed today.

## 2013-08-21 NOTE — Telephone Encounter (Signed)
Prior authorization reviewed and signed by Regina Eck CNM. Faxed [facility] to [facility].

## 2013-09-02 NOTE — Telephone Encounter (Signed)
Can we request an appeal the decision?

## 2013-09-02 NOTE — Telephone Encounter (Signed)
Leah Sullivan, the "Quantity limit exception" request for this patient was denied by her insurance. They state the dose of medication exceeds limits. Patient is currently on Activella 2 tablets po daily and they will not cover more than one daily.  What do you advise?  Spoke with Rep-Kay at [employer]

## 2013-09-02 NOTE — Telephone Encounter (Signed)
Patient is having some trouble getting her prescription approved . [place] is asking does she need to change to a different prescription?

## 2013-09-04 ENCOUNTER — Other Ambulatory Visit: Payer: Self-pay | Admitting: Certified Nurse Midwife

## 2013-09-04 MED ORDER — ESTRADIOL 1 MG PO TABS
1.0000 mg | ORAL_TABLET | Freq: Every day | ORAL | Status: DC
Start: 2013-09-04 — End: 2014-10-02

## 2013-09-04 MED ORDER — ESTRADIOL-NORETHINDRONE ACET 1-0.5 MG PO TABS
1.0000 | ORAL_TABLET | Freq: Every day | ORAL | Status: DC
Start: 2013-09-04 — End: 2013-09-25

## 2013-09-04 MED ORDER — ESTRADIOL 1 MG PO TABS: 1.0000 mg | ORAL_TABLET | Freq: Every day | ORAL | Status: DC

## 2013-09-04 MED ORDER — MEDROXYPROGESTERONE ACETATE 2.5 MG PO TABS: 2.5000 mg | ORAL_TABLET | Freq: Every day | ORAL | Status: DC

## 2013-09-04 NOTE — Telephone Encounter (Signed)
There is not another oral medication that is comparable to [place]. I sent Rx to [facility] with one refill and we can discuss at aex.

## 2013-09-04 NOTE — Telephone Encounter (Signed)
Leah Sullivan, patient calling. She would like to change to an rx that she can purchase locally and not have to use her insurance company as she now has a new plan that will not cover the [place] at all. She states she has been doing well on one tablet per day for 3 months and had cut her dosage due to cost.  She would like new rx, prefers not activella due to cost.  I advised I would send you a message and return her call. Patient has aex with you scheduled in May but that she has her last pill today and would like a new rx.  Would like rx sent to [facility] on [address].

## 2013-09-04 NOTE — Telephone Encounter (Addendum)
[facility] in [facility]. Transferred x 6 to [facility] and [facility] and they were all unable to dc orders since they had been placed today. Will need to dc orders.

## 2013-09-04 NOTE — Telephone Encounter (Signed)
Order signed.

## 2013-09-04 NOTE — Addendum Note (Signed)
Addended by: Michele Mcalpine on: 09/04/2013 02:34 PM   Modules accepted: Orders

## 2013-09-04 NOTE — Telephone Encounter (Addendum)
Late note entry from 4/16 1:05pm telephone encounter. Advised patient of message from [place]. Patient states "The whole reason I called was to switch off of Activella because it will cost me 250 doll for one month." Patient does not want to take [place] anymore and wants different options. Advised would speak with provider about other options and call patient back. Patient agreeable.

## 2013-09-05 ENCOUNTER — Ambulatory Visit (HOSPITAL_BASED_OUTPATIENT_CLINIC_OR_DEPARTMENT_OTHER): Payer: No Typology Code available for payment source

## 2013-09-05 DIAGNOSIS — D473 Essential (hemorrhagic) thrombocythemia: Secondary | ICD-10-CM

## 2013-09-05 DIAGNOSIS — D75839 Thrombocytosis, unspecified: Secondary | ICD-10-CM

## 2013-09-05 LAB — CBC WITH DIFFERENTIAL/PLATELET
BASO%: 1 % (ref 0.0–2.0)
BASOS ABS: 0.1 10*3/uL (ref 0.0–0.1)
EOS%: 1.7 % (ref 0.0–7.0)
Eosinophils Absolute: 0.2 10*3/uL (ref 0.0–0.5)
HCT: 39.2 % (ref 34.8–46.6)
HEMOGLOBIN: 13 g/dL (ref 11.6–15.9)
LYMPH#: 1.8 10*3/uL (ref 0.9–3.3)
LYMPH%: 20.3 % (ref 14.0–49.7)
MCH: 28.3 pg (ref 25.1–34.0)
MCHC: 33.1 g/dL (ref 31.5–36.0)
MCV: 85.6 fL (ref 79.5–101.0)
MONO#: 0.6 10*3/uL (ref 0.1–0.9)
MONO%: 6.6 % (ref 0.0–14.0)
NEUT#: 6.3 10*3/uL (ref 1.5–6.5)
NEUT%: 70.4 % (ref 38.4–76.8)
Platelets: 570 10*3/uL — ABNORMAL HIGH (ref 145–400)
RBC: 4.58 10*6/uL (ref 3.70–5.45)
RDW: 14.2 % (ref 11.2–14.5)
WBC: 9 10*3/uL (ref 3.9–10.3)

## 2013-09-08 LAB — ANA: Anti Nuclear Antibody(ANA): POSITIVE — AB

## 2013-09-08 LAB — ANTI-NUCLEAR AB-TITER (ANA TITER): ANA TITER 1: NEGATIVE (ref <1:40)

## 2013-09-08 LAB — SEDIMENTATION RATE: Sed Rate: 7 mm/hr (ref 0–22)

## 2013-09-08 LAB — C-REACTIVE PROTEIN

## 2013-09-25 ENCOUNTER — Ambulatory Visit (INDEPENDENT_AMBULATORY_CARE_PROVIDER_SITE_OTHER): Payer: No Typology Code available for payment source | Admitting: Certified Nurse Midwife

## 2013-09-25 ENCOUNTER — Encounter: Payer: Self-pay | Admitting: Certified Nurse Midwife

## 2013-09-25 VITALS — BP 110/72 | HR 72 | Resp 16 | Ht 67.75 in | Wt 235.0 lb

## 2013-09-25 DIAGNOSIS — I10 Essential (primary) hypertension: Secondary | ICD-10-CM

## 2013-09-25 DIAGNOSIS — Z01419 Encounter for gynecological examination (general) (routine) without abnormal findings: Secondary | ICD-10-CM

## 2013-09-25 NOTE — Progress Notes (Signed)
53 y.o. G0P0000 Single Caucasian Fe here for annual exam. Menopausal on HRT, working well, desires continuance. Sees PCP for hypertension management yearly , labs and aex. No medication change. Has lost 40 pounds since last visit!! Feels so much better. No health issues today. 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   Patient's last menstrual period was 01/20/2006.          Sexually active: no  The current method of family planning is status post hysterectomy.    Exercising: yes  walk Smoker:  no  Health Maintenance: Pap: 10-27-05 neg MMG: 06-23-13 normal Colonoscopy:  none BMD:   none TDaP:  2014 Labs: none Self breast exam: done occ   reports that she has quit smoking. She does not have any smokeless tobacco history on file. She reports that she does not drink alcohol or use illicit drugs.  Past Medical History  Diagnosis Date  . Fibroid   . Depression     mild  . Endometriosis   . Adenomyosis   . Hypertension     Past Surgical History  Procedure Laterality Date  . Bunionectomy    . Total abdominal hysterectomy  9/07  . Inner ear surgery Left     hole in eardrum    Current Outpatient Prescriptions  Medication Sig Dispense Refill  . estradiol (ESTRACE) 1 MG tablet Take 1 tablet (1 mg total) by mouth daily.  90 tablet  4  . FLUoxetine (PROZAC) 20 MG capsule TAKE ONE CAPSULE BY MOUTH ONE TIME DAILY  90 capsule  2  . hydrochlorothiazide (HYDRODIURIL) 25 MG tablet Take 25 mg by mouth daily.      Marland Kitchen losartan (COZAAR) 25 MG tablet Take 25 mg by mouth daily.      . medroxyPROGESTERone (PROVERA) 2.5 MG tablet Take 1 tablet (2.5 mg total) by mouth daily.  90 tablet  4  . Multiple Vitamins-Minerals (MULTIVITAMIN PO) Take by mouth daily.       No current facility-administered medications for this visit.    Family History  Problem Relation Age of Onset  . Hypertension Mother   . Breast cancer Maternal Aunt   . Breast cancer Maternal Grandmother   . Breast  cancer Maternal Aunt     ROS:  Pertinent items are noted in HPI.  Otherwise, a comprehensive ROS was negative.  Exam:   BP 110/72  Pulse 72  Resp 16  Ht 5' 7.75" (1.721 m)  Wt 235 lb (106.595 kg)  BMI 35.99 kg/m2  LMP 01/20/2006 Height: 5' 7.75" (172.1 cm)  Ht Readings from Last 3 Encounters:  09/25/13 5' 7.75" (1.721 m)  07/11/13 5\' 8"  (1.727 m)  06/13/13 5' 8.25" (1.734 m)    General appearance: alert, cooperative and appears stated age Head: Normocephalic, without obvious abnormality, atraumatic Neck: no adenopathy, supple, symmetrical, trachea midline and thyroid normal to inspection and palpation and non-palpable Lungs: clear to auscultation bilaterally Breasts: normal appearance, no masses or tenderness, No nipple retraction or dimpling, No nipple discharge or bleeding, No axillary or supraclavicular adenopathy Heart: regular rate and rhythm Abdomen: soft, non-tender; no masses,  no organomegaly Extremities: extremities normal, atraumatic, no cyanosis or edema Skin: Skin color, texture, turgor normal. No rashes or lesions Lymph nodes: Cervical, supraclavicular, and axillary nodes normal. No abnormal inguinal nodes palpated Neurologic: Grossly normal   Pelvic: External genitalia:  no lesions              Urethra:  normal appearing urethra with no masses,  tenderness or lesions              Bartholin's and Skene's: normal                 Vagina: normal appearing vagina with normal color and discharge, no lesions              Cervix: absent              Pap taken: no Bimanual Exam:  Uterus:  uterus absent              Adnexa: no mass, fullness, tenderness and adnexa absent bilateral               Rectovaginal: Confirms               Anus:  normal sphincter tone, no lesions  A:  Well Woman with normal exam  Menopausal HRT working well S/P TAH with BSO fibroids  Hypertension stable medication with PCP management  Colonoscopy due, plans to schedule later this year  P:    Reviewed health and wellness pertinent to exam  Rx Estrace see order  Rx Provera see order  Continue follow up with PCP  Will call when ready for referral  Pap smear taken today  Mammogram yearly  counseled on mammography screening, use and side effects of HRT, adequate intake of calcium and vitamin D, diet and exercise  return annually or prn  An After Visit Summary was printed and given to the patient.

## 2013-09-25 NOTE — Patient Instructions (Signed)

## 2013-09-26 NOTE — Progress Notes (Signed)
Reviewed personally.  M. Suzanne Carrigan Delafuente, MD.  

## 2013-10-31 ENCOUNTER — Ambulatory Visit: Payer: No Typology Code available for payment source

## 2013-10-31 ENCOUNTER — Other Ambulatory Visit: Payer: No Typology Code available for payment source

## 2014-03-06 ENCOUNTER — Other Ambulatory Visit: Payer: Self-pay

## 2014-09-28 ENCOUNTER — Other Ambulatory Visit: Payer: Self-pay | Admitting: Obstetrics & Gynecology

## 2014-09-29 NOTE — Telephone Encounter (Signed)
S/w patient she say she will be good with her refills until she comes in for appointment, will call us if needed before then.

## 2014-10-02 ENCOUNTER — Ambulatory Visit (INDEPENDENT_AMBULATORY_CARE_PROVIDER_SITE_OTHER): Payer: No Typology Code available for payment source | Admitting: Certified Nurse Midwife

## 2014-10-02 ENCOUNTER — Encounter: Payer: Self-pay | Admitting: Certified Nurse Midwife

## 2014-10-02 VITALS — BP 110/72 | HR 68 | Resp 16 | Ht 67.75 in | Wt 256.0 lb

## 2014-10-02 DIAGNOSIS — Z01419 Encounter for gynecological examination (general) (routine) without abnormal findings: Secondary | ICD-10-CM | POA: Diagnosis not present

## 2014-10-02 DIAGNOSIS — N951 Menopausal and female climacteric states: Secondary | ICD-10-CM

## 2014-10-02 DIAGNOSIS — Z0000 Encounter for general adult medical examination without abnormal findings: Secondary | ICD-10-CM | POA: Diagnosis not present

## 2014-10-02 LAB — POCT URINALYSIS DIPSTICK
Bilirubin, UA: NEGATIVE
Blood, UA: NEGATIVE
Glucose, UA: NEGATIVE
Ketones, UA: NEGATIVE
LEUKOCYTES UA: NEGATIVE
NITRITE UA: NEGATIVE
Protein, UA: NEGATIVE
Urobilinogen, UA: NEGATIVE
pH, UA: 5

## 2014-10-02 MED ORDER — MEDROXYPROGESTERONE ACETATE 2.5 MG PO TABS: 2.5000 mg | ORAL_TABLET | Freq: Every day | ORAL | Status: DC

## 2014-10-02 MED ORDER — ESTRADIOL 1 MG PO TABS: 1.0000 mg | ORAL_TABLET | Freq: Every day | ORAL | Status: DC

## 2014-10-02 NOTE — Patient Instructions (Signed)

## 2014-10-02 NOTE — Progress Notes (Signed)
54 y.o. G0P0000 Single  Caucasian Fe here for annual exam. Menopausal  HRT, no hot flashes or night sweats. Denies vaginal dryness. Sees [facility] for aex/labs yearly/ antidepressant/ hypertension medications stable. Mother had stroke earlier this year so working with care with her at this point. Emotionally doing well. Not sexual active. Aware weight is up, due to stress, planning to start weight loss very soon. Had lost 30 pounds in past. No other health issues today.  Patient's last menstrual period was 01/20/2006.          Sexually active: No.  The current method of family planning is status post hysterectomy.    Exercising: Yes.    walk Smoker:  no  Health Maintenance: Pap:  10-27-05 neg MMG:  06-23-13 neg patient plans to schedule Colonoscopy:  None  Has IFOB from [facility]. BMD:   none TDaP:  2014 Labs: Poct urine-neg Self breast exam: done occ   reports that she has quit smoking. She does not have any smokeless tobacco history on file. She reports that she drinks alcohol. She reports that she does not use illicit drugs.  Past Medical History  Diagnosis Date  . Fibroid   . Depression     mild  . Endometriosis   . Adenomyosis   . Hypertension     Past Surgical History  Procedure Laterality Date  . Bunionectomy    . Total abdominal hysterectomy  9/07  . Inner ear surgery Left     hole in eardrum    Current Outpatient Prescriptions  Medication Sig Dispense Refill  . ALPRAZolam (XANAX) 0.5 MG tablet   0  . estradiol (ESTRACE) 1 MG tablet Take 1 tablet (1 mg total) by mouth daily. 90 tablet 4  . FLUoxetine (PROZAC) 20 MG capsule TAKE ONE CAPSULE BY MOUTH ONE TIME DAILY 90 capsule 2  . hydrochlorothiazide (HYDRODIURIL) 25 MG tablet Take 25 mg by mouth daily.    Marland Kitchen losartan (COZAAR) 25 MG tablet Take 25 mg by mouth daily.    . medroxyPROGESTERone (PROVERA) 2.5 MG tablet Take 1 tablet (2.5 mg total) by mouth daily. 90 tablet 4   No current facility-administered medications for  this visit.    Family History  Problem Relation Age of Onset  . Hypertension Mother   . Breast cancer Maternal Aunt   . Breast cancer Maternal Grandmother   . Breast cancer Maternal Aunt     ROS:  Pertinent items are noted in HPI.  Otherwise, a comprehensive ROS was negative.  Exam:   BP 110/72 mmHg  Pulse 68  Resp 16  Ht 5' 7.75" (1.721 m)  Wt 256 lb (116.121 kg)  BMI 39.21 kg/m2  LMP 01/20/2006 Height: 5' 7.75" (172.1 cm) Ht Readings from Last 3 Encounters:  10/02/14 5' 7.75" (1.721 m)  09/25/13 5' 7.75" (1.721 m)  07/11/13 5\' 8"  (1.727 m)    General appearance: alert, cooperative and appears stated age Head: Normocephalic, without obvious abnormality, atraumatic Neck: no adenopathy, supple, symmetrical, trachea midline and thyroid normal to inspection and palpation Lungs: clear to auscultation bilaterally Breasts: normal appearance, no masses or tenderness, No nipple retraction or dimpling, No nipple discharge or bleeding, No axillary or supraclavicular adenopathy Heart: regular rate and rhythm Abdomen: soft, non-tender; no masses,  no organomegaly Extremities: extremities normal, atraumatic, no cyanosis or edema Skin: Skin color, texture, turgor normal. No rashes or lesions Lymph nodes: Cervical, supraclavicular, and axillary nodes normal. No abnormal inguinal nodes palpated Neurologic: Grossly normal   Pelvic: External  genitalia:  no lesions              Urethra:  normal appearing urethra with no masses, tenderness or lesions              Bartholin's and Skene's: normal                 Vagina: normal appearing vagina with normal color and discharge, no lesions              Cervix: absent              Pap taken: No. Bimanual Exam:  Uterus:  uterus absent              Adnexa: no mass, fullness, tenderness adnexal absent bilateral               Rectovaginal: Confirms               Anus:  normal appearance, refused rectal exam  Chaperone present: Yes  A:  Well  Woman with normal exam  Menopausal on HRT desires continuance  Hypertension on stable medication with PCP management   Social stress with mother's stroke, has good family support  P:   Reviewed health and wellness pertinent to exam  Rx Provera 2.5 mg see order  Rx Estrace 1 mg see order  Continue follow up with PCP as indicated  Pap smearnot taken today   counseled on breast self exam, mammography screening, use and side effects of HRT, adequate intake of calcium and vitamin D  return annually or prn  An After Visit Summary was printed and given to the patient.

## 2014-10-04 NOTE — Progress Notes (Signed)
Reviewed personally.  M. Suzanne Kaveri Perras, MD.  

## 2014-10-07 ENCOUNTER — Other Ambulatory Visit: Payer: Self-pay

## 2014-10-07 DIAGNOSIS — Z1231 Encounter for screening mammogram for malignant neoplasm of breast: Secondary | ICD-10-CM

## 2014-10-15 ENCOUNTER — Ambulatory Visit
Admission: RE | Admit: 2014-10-15 | Discharge: 2014-10-15 | Disposition: A | Discharge status: Home / Self Care | Payer: No Typology Code available for payment source | Source: Ambulatory Visit

## 2014-10-15 DIAGNOSIS — Z1231 Encounter for screening mammogram for malignant neoplasm of breast: Secondary | ICD-10-CM

## 2015-01-10 ENCOUNTER — Other Ambulatory Visit: Payer: Self-pay | Admitting: Obstetrics & Gynecology

## 2015-01-11 ENCOUNTER — Other Ambulatory Visit: Payer: Self-pay | Admitting: Obstetrics & Gynecology

## 2015-01-11 NOTE — Telephone Encounter (Signed)
Provera 2.5 mg #90/4 rfs sent to [facility] [place]- rx denied.

## 2015-01-11 NOTE — Telephone Encounter (Signed)
10/02/14 #90/4 rfs was [facility] [address], called they do not have the rx that was setnt. S/w pharmacist Johann Capers and called in Provera 2.5 mg 1 po q daily #90/4 rfs  Rx denied through our system.  Encounter closed.

## 2015-06-01 ENCOUNTER — Telehealth: Payer: Self-pay | Admitting: Certified Nurse Midwife

## 2015-06-01 NOTE — Telephone Encounter (Signed)
LMTCB about canceled appointment °

## 2015-09-06 ENCOUNTER — Other Ambulatory Visit: Payer: Self-pay | Admitting: Obstetrics & Gynecology

## 2015-09-07 ENCOUNTER — Telehealth: Payer: Self-pay | Admitting: Certified Nurse Midwife

## 2015-09-07 MED ORDER — FLUOXETINE HCL 20 MG PO CAPS: 20.0000 mg | ORAL_CAPSULE | Freq: Every day | ORAL | Status: DC

## 2015-09-07 NOTE — Telephone Encounter (Signed)
Medication refill request: Fluoxetine 20mg  Last AEX:  10/02/14 DL Next AEX: 10/22/15 w/ DL Last MMG (if hormonal medication request): 10/15/14 BIRADS1 negative Refill authorized: 11/28/12 #90 w/2 refills today #90 w/ 0 refills?

## 2015-09-07 NOTE — Telephone Encounter (Signed)
Remigio Eisenmenger, CMA at 09/07/2015 12:07 PM     Status: Signed       Expand All Collapse All   Tried calling patient regarding medicine being prescribed by PCP. No answer, left VM to call office back.            Regina Eck, CNM at 09/07/2015 11:02 AM     Status: Signed       Expand All Collapse All   Per chart she was obtaining from PCP            Remigio Eisenmenger, CMA at 09/07/2015 8:21 AM     Status: Signed       Expand All Collapse All   Medication refill request: Fluoxetine 20mg  Last AEX: 10/02/14 DL Next AEX: 10/22/15 w/ DL Last MMG (if hormonal medication request): 10/15/14 BIRADS1 negative Refill authorized: 11/28/12 #90 w/2 refills today #90 w/ 0 refills?        Spoke with patient regarding refill request. Patient states that she was unaware her PCP had been filling this medication for her. Reports Dr.Miller started her on this medication originally. Advised per our records we last filled this for her in 2014. Patient states she has been taking this medication daily and had not stopped taking the medication at any point since starting it. Advised patient she will need to contact her PCP regarding refill. "So you are not going to fill it?" Advised since her PCP has taken over this rx we are unable to refill it. "I did not know that they took over filling it. I thought it was always your office. They told me they could not fill it." Patient is unable to express why her PCP would not fill the medication. She is requesting that I speak with Dr.Miller and return call.

## 2015-09-07 NOTE — Telephone Encounter (Signed)
Patient states she is returning a call to [employer]. No previous message. Routing to Clinical Triage for review.

## 2015-09-07 NOTE — Telephone Encounter (Signed)
Tried calling patient regarding medicine being prescribed by PCP. No answer, left VM to call office back.

## 2015-09-07 NOTE — Telephone Encounter (Signed)
Per chart she was obtaining from PCP

## 2015-09-07 NOTE — Telephone Encounter (Signed)
RF done for #90/1RF.  We are happy to do this but she should decide if she wants [place] or her PCP to do this for consistency.  Thanks.

## 2015-09-08 NOTE — Telephone Encounter (Signed)
Spoke with patient. Advised of message as seen below from Dr.Miller. She is agreeable and verbalizes understanding.  Routing to provider for final review. Patient agreeable to disposition. Will close encounter.  

## 2015-09-08 NOTE — Telephone Encounter (Signed)
Left message to call Kaitlyn at [phone]. 

## 2015-09-23 DIAGNOSIS — G4733 Obstructive sleep apnea (adult) (pediatric): Secondary | ICD-10-CM | POA: Diagnosis not present

## 2015-10-15 ENCOUNTER — Ambulatory Visit: Payer: No Typology Code available for payment source | Admitting: Certified Nurse Midwife

## 2015-10-22 ENCOUNTER — Encounter: Payer: Self-pay | Admitting: Certified Nurse Midwife

## 2015-10-22 ENCOUNTER — Ambulatory Visit (INDEPENDENT_AMBULATORY_CARE_PROVIDER_SITE_OTHER): Payer: BLUE CROSS/BLUE SHIELD | Admitting: Certified Nurse Midwife

## 2015-10-22 VITALS — BP 108/72 | HR 70 | Resp 16 | Ht 67.75 in | Wt 287.0 lb

## 2015-10-22 DIAGNOSIS — Z01419 Encounter for gynecological examination (general) (routine) without abnormal findings: Secondary | ICD-10-CM | POA: Diagnosis not present

## 2015-10-22 DIAGNOSIS — Z0000 Encounter for general adult medical examination without abnormal findings: Secondary | ICD-10-CM | POA: Diagnosis not present

## 2015-10-22 DIAGNOSIS — N951 Menopausal and female climacteric states: Secondary | ICD-10-CM | POA: Diagnosis not present

## 2015-10-22 LAB — POCT URINALYSIS DIPSTICK
Bilirubin, UA: NEGATIVE
Glucose, UA: NEGATIVE
Ketones, UA: NEGATIVE
Leukocytes, UA: NEGATIVE
NITRITE UA: NEGATIVE
Protein, UA: NEGATIVE
RBC UA: NEGATIVE
UROBILINOGEN UA: NEGATIVE
pH, UA: 5

## 2015-10-22 MED ORDER — MEDROXYPROGESTERONE ACETATE 2.5 MG PO TABS: 2.5000 mg | ORAL_TABLET | Freq: Every day | ORAL | Status: DC

## 2015-10-22 MED ORDER — ESTRADIOL 1 MG PO TABS: 1.0000 mg | ORAL_TABLET | Freq: Every day | ORAL | Status: DC

## 2015-10-22 NOTE — Patient Instructions (Signed)

## 2015-10-22 NOTE — Progress Notes (Signed)
55 y.o. G0P0000 Single  Caucasian Fe here for annual exam. Menopausal on HRT, working well. Patient sees Edison Pace FNP for hypertension,anxiety and labs and aex. Denies vaginal bleeding or dryness. Has gained 31 pounds and now is using C pap for sleep. Patient plans on working on weight loss this year. She wants to be off medications. HRT working well, occasional hot flashes only. Not sexually active. No health concerns today.  Patient's last menstrual period was 01/20/2006.          Sexually active: No.  The current method of family planning is status post hysterectomy.    Exercising: Yes.    walk Smoker:  no  Health Maintenance: Pap: 10-27-05 neg MMG:  10-15-14 category b density birads 1:neg Colonoscopy:  none BMD:   none TDaP:  2014 Shingles: no Pneumonia: no Hep C and [id number] blood Labs: poct urine-neg Self breast exam: done very occ   reports that she has quit smoking. She does not have any smokeless tobacco history on file. She reports that she drinks alcohol. She reports that she does not use illicit drugs.  Past Medical History  Diagnosis Date  . Fibroid   . Depression     mild  . Endometriosis   . Adenomyosis   . Hypertension   . Sleep apnea     Past Surgical History  Procedure Laterality Date  . Bunionectomy    . Total abdominal hysterectomy  9/07  . Inner ear surgery Left     hole in eardrum    Current Outpatient Prescriptions  Medication Sig Dispense Refill  . estradiol (ESTRACE) 1 MG tablet Take 1 tablet (1 mg total) by mouth daily. 90 tablet 4  . FLUoxetine (PROZAC) 20 MG capsule Take 1 capsule (20 mg total) by mouth daily. 90 capsule 1  . hydrochlorothiazide (HYDRODIURIL) 25 MG tablet Take 25 mg by mouth daily.    Marland Kitchen losartan (COZAAR) 25 MG tablet Take 25 mg by mouth daily.    . medroxyPROGESTERone (PROVERA) 2.5 MG tablet Take 1 tablet (2.5 mg total) by mouth daily. 90 tablet 4   No current facility-administered medications for this visit.    Family  History  Problem Relation Age of Onset  . Hypertension Mother   . Stroke Mother   . Breast cancer Maternal Aunt   . Breast cancer Maternal Grandmother   . Breast cancer Maternal Aunt     ROS:  Pertinent items are noted in HPI.  Otherwise, a comprehensive ROS was negative.  Exam:   BP 108/72 mmHg  Pulse 70  Resp 16  Ht 5' 7.75" (1.721 m)  Wt 287 lb (130.182 kg)  BMI 43.95 kg/m2  LMP 01/20/2006 Height: 5' 7.75" (172.1 cm) Ht Readings from Last 3 Encounters:  10/22/15 5' 7.75" (1.721 m)  10/02/14 5' 7.75" (1.721 m)  09/25/13 5' 7.75" (1.721 m)    General appearance: alert, cooperative and appears stated age Head: Normocephalic, without obvious abnormality, atraumatic Neck: no adenopathy, supple, symmetrical, trachea midline and thyroid normal to inspection and palpation Lungs: clear to auscultation bilaterally Breasts: normal appearance, no masses or tenderness, No nipple retraction or dimpling, No nipple discharge or bleeding, No axillary or supraclavicular adenopathy Heart: regular rate and rhythm Abdomen: soft, non-tender; no masses,  no organomegaly Extremities: extremities normal, atraumatic, no cyanosis or edema Skin: Skin color, texture, turgor normal. No rashes or lesions Lymph nodes: Cervical, supraclavicular, and axillary nodes normal. No abnormal inguinal nodes palpated Neurologic: Grossly normal   Pelvic: External genitalia:  no lesions              Urethra:  normal appearing urethra with no masses, tenderness or lesions              Bartholin's and Skene's: normal                 Vagina: normal appearing vagina with normal color and discharge, no lesions              Cervix: absent              Pap taken: No. Bimanual Exam:  Uterus:  uterus absent              Adnexa: no mass, fullness, tenderness               Rectovaginal: Confirmed               Anus:  normal appearance, refused rectal exam  Chaperone present: yes  A:  Well Woman with normal  exam  Menopausal on HRT desires continuance s/p TAH due to bleeding  Hypertension and anxiety with PCP management  P:   Reviewed health and wellness pertinent to exam  Discussed if hypertension well controlled will need to discontinue or reduce HRT. Patient aware and will advise if changes.  Rx Estrace see order  Rx Provera see order  Continue follow with MD as indicated  Pap smear as above not taken   counseled on breast self exam, mammography screening, use and side effects of HRT, menopause, adequate intake of calcium and vitamin D, diet and exercise, discussed risks and benefits of colonoscopy, declines scheduling and will do IFOB with PCP as before.  return annually or prn  An After Visit Summary was printed and given to the patient.

## 2015-10-24 DIAGNOSIS — G4733 Obstructive sleep apnea (adult) (pediatric): Secondary | ICD-10-CM | POA: Diagnosis not present

## 2015-10-25 NOTE — Progress Notes (Signed)
Reviewed personally.  M. Suzanne Odai Wimmer, MD.  

## 2015-11-10 ENCOUNTER — Other Ambulatory Visit: Payer: Self-pay | Admitting: Certified Nurse Midwife

## 2015-11-10 NOTE — Telephone Encounter (Signed)
Medication refill request: Estradiol 1mg  Last AEX:  10/22/15 DL Next AEX: 10/27/16  Last MMG (if hormonal medication request): 10/15/14 BIRADS1 negative Refill authorized: 10/22/15 #90 w/4 refills; this was refilled at appointment and we received a receipt of confirmation from the pharmacy.

## 2015-11-11 NOTE — Telephone Encounter (Signed)
Per her med list she should have refills. Please call pharmacy to check

## 2015-11-11 NOTE — Telephone Encounter (Signed)
Spoke with patient, she is okay with refills. Closing this encounter.

## 2015-11-22 DIAGNOSIS — G4733 Obstructive sleep apnea (adult) (pediatric): Secondary | ICD-10-CM | POA: Diagnosis not present

## 2015-11-23 DIAGNOSIS — G4733 Obstructive sleep apnea (adult) (pediatric): Secondary | ICD-10-CM | POA: Diagnosis not present

## 2015-11-29 DIAGNOSIS — Z79899 Other long term (current) drug therapy: Secondary | ICD-10-CM | POA: Diagnosis not present

## 2015-11-29 DIAGNOSIS — D473 Essential (hemorrhagic) thrombocythemia: Secondary | ICD-10-CM | POA: Diagnosis not present

## 2015-11-29 DIAGNOSIS — G4733 Obstructive sleep apnea (adult) (pediatric): Secondary | ICD-10-CM | POA: Diagnosis not present

## 2015-11-29 DIAGNOSIS — Z1211 Encounter for screening for malignant neoplasm of colon: Secondary | ICD-10-CM | POA: Diagnosis not present

## 2015-11-29 DIAGNOSIS — E7800 Pure hypercholesterolemia, unspecified: Secondary | ICD-10-CM | POA: Diagnosis not present

## 2015-11-29 DIAGNOSIS — F418 Other specified anxiety disorders: Secondary | ICD-10-CM | POA: Diagnosis not present

## 2015-11-29 DIAGNOSIS — I10 Essential (primary) hypertension: Secondary | ICD-10-CM | POA: Diagnosis not present

## 2015-12-31 ENCOUNTER — Encounter: Payer: Self-pay | Admitting: Hematology and Oncology

## 2015-12-31 ENCOUNTER — Telehealth: Payer: Self-pay | Admitting: Hematology and Oncology

## 2015-12-31 NOTE — Telephone Encounter (Signed)
Appointment scheduled with Dr. Alvy Bimler on 8/21. Patient aware to arrive 30 minutes early. Demographics verified. Letter to the referring and mailed to the patient.

## 2016-01-03 DIAGNOSIS — M25512 Pain in left shoulder: Secondary | ICD-10-CM | POA: Diagnosis not present

## 2016-01-10 ENCOUNTER — Ambulatory Visit (HOSPITAL_BASED_OUTPATIENT_CLINIC_OR_DEPARTMENT_OTHER): Payer: BLUE CROSS/BLUE SHIELD | Admitting: Hematology and Oncology

## 2016-01-10 ENCOUNTER — Encounter: Payer: Self-pay | Admitting: Hematology and Oncology

## 2016-01-10 DIAGNOSIS — I10 Essential (primary) hypertension: Secondary | ICD-10-CM

## 2016-01-10 DIAGNOSIS — Z79890 Hormone replacement therapy (postmenopausal): Secondary | ICD-10-CM | POA: Diagnosis not present

## 2016-01-10 DIAGNOSIS — D473 Essential (hemorrhagic) thrombocythemia: Secondary | ICD-10-CM | POA: Diagnosis not present

## 2016-01-10 DIAGNOSIS — D75839 Thrombocytosis, unspecified: Secondary | ICD-10-CM

## 2016-01-10 NOTE — Assessment & Plan Note (Signed)
she will continue current medical management. I recommend close follow-up with primary care doctor for medication adjustment.  

## 2016-01-10 NOTE — Assessment & Plan Note (Signed)
The patient is on long-term hormone replacement therapy. She would be at risk of thrombosis. I recommend she wean off HRT in the near future under the direction of her gynecologist

## 2016-01-10 NOTE — Assessment & Plan Note (Addendum)
This is likely related to undiagnosed myeloproliferative disorder. We discussed the risk and benefit of ordering additional workup to exclude essential thrombocytosis. Previously, she had JAK2 mutation study ordered but there are other second generation sequencing studies such as MPL and CAL-R that would be helpful instead of going for bone marrow aspirate and biopsy. We discussed extensively and ultimately, the patient would like to hold off ordering additional workup. She is comfortable with follow up appointment with her primary care doctor. I recommend she stays on aspirin therapy. She is in agreement to return here if her platelet count is over 600,000

## 2016-01-10 NOTE — Progress Notes (Signed)
[name]  Patient Care Team: Eloise Levels, NP as PCP - General (Nurse Practitioner)  CHIEF COMPLAINTS/PURPOSE OF VISIT:  Chronic thrombocytosis  HISTORY OF PRESENTING ILLNESS:  Leah Sullivan 55 y.o. female was transferred to my care after her prior physician has left.  I reviewed the patient's records extensive and collaborated the history with the patient. Summary of her history is as follows: The patient have chronic thrombocytosis at least since 2014. Her platelet count typically range over 500,000 to 600,000. She has a routine visit with her primary care doctor on 12/07/2015. Her CBC show white count 10.7, hemoglobin 12.6 and platelet count of 525,000. Prior blood work for [employer] mutation on 06/24/2013 was negative The patient denies prior history of blood clots. She denies erythromelalgia, night sweats, abnormal abdominal pain or abnormal weight loss. She has gained a lot of weight due to lack of physical activity The patient takes long-term HRT  MEDICAL HISTORY:  Past Medical History:  Diagnosis Date  . Adenomyosis   . Depression    mild  . Endometriosis   . Fibroid   . Hypertension   . Sleep apnea     SURGICAL HISTORY: Past Surgical History:  Procedure Laterality Date  . BUNIONECTOMY    . INNER EAR SURGERY Left    hole in eardrum  . TOTAL ABDOMINAL HYSTERECTOMY  9/07    SOCIAL HISTORY: Social History   Social History  . Marital status: Single    Spouse name: N/A  . Number of children: N/A  . Years of education: N/A   Occupational History  . CPA    Social History Main Topics  . Smoking status: Former Research scientist (life sciences)  . Smokeless tobacco: Never Used  . Alcohol use 0.0 oz/week  . Drug use: No  . Sexual activity: No     Comment: TAH   Other Topics Concern  . Not on file   Social History Narrative  . No narrative on file    FAMILY HISTORY: Family History  Problem Relation Age of Onset  . Hypertension Mother    . Stroke Mother   . Breast cancer Maternal Aunt   . Breast cancer Maternal Grandmother   . Breast cancer Maternal Aunt     ALLERGIES:  has No Known Allergies.  MEDICATIONS:  Current Outpatient Prescriptions  Medication Sig Dispense Refill  . estradiol (ESTRACE) 1 MG tablet Take 1 tablet (1 mg total) by mouth daily. 90 tablet 4  . FLUoxetine (PROZAC) 20 MG capsule Take 1 capsule (20 mg total) by mouth daily. 90 capsule 1  . hydrochlorothiazide (HYDRODIURIL) 25 MG tablet Take 25 mg by mouth daily.    Marland Kitchen losartan (COZAAR) 25 MG tablet Take 25 mg by mouth daily.    . medroxyPROGESTERone (PROVERA) 2.5 MG tablet Take 1 tablet (2.5 mg total) by mouth daily. 90 tablet 4   No current facility-administered medications for this visit.     REVIEW OF SYSTEMS:   Constitutional: Denies fevers, chills or abnormal night sweats Eyes: Denies blurriness of vision, double vision or watery eyes Ears, nose, mouth, throat, and face: Denies mucositis or sore throat Respiratory: Denies cough, dyspnea or wheezes Cardiovascular: Denies palpitation, chest discomfort or lower extremity swelling Gastrointestinal:  Denies nausea, heartburn or change in bowel habits Skin: Denies abnormal skin rashes Lymphatics: Denies new lymphadenopathy or easy bruising Neurological:Denies numbness, tingling or new weaknesses Behavioral/Psych: Mood is stable, no new changes  All other systems were reviewed with the patient and are  negative.  PHYSICAL EXAMINATION: ECOG PERFORMANCE STATUS: 1 - Symptomatic but completely ambulatory  Vitals:   01/10/16 1146  BP: (!) 154/85  Pulse: 64  Resp: 18  Temp: 97.8 F (36.6 C)   Filed Weights   01/10/16 1146  Weight: 284 lb 12.8 oz (129.2 kg)    GENERAL:alert, no distress and comfortable. She is morbidly obese SKIN: skin color, texture, turgor are normal, no rashes or significant lesions EYES: normal, conjunctiva are pink and non-injected, sclera clear OROPHARYNX:no exudate,  normal lips, buccal mucosa, and tongue  NECK: supple, thyroid normal size, non-tender, without nodularity LYMPH:  no palpable lymphadenopathy in the cervical, axillary or inguinal LUNGS: clear to auscultation and percussion with normal breathing effort HEART: regular rate & rhythm and no murmurs without lower extremity edema ABDOMEN:abdomen soft, non-tender and normal bowel sounds Musculoskeletal:no cyanosis of digits and no clubbing  PSYCH: alert & oriented x 3 with fluent speech NEURO: no focal motor/sensory deficits  LABORATORY DATA:  I have reviewed the data as listed Lab Results  Component Value Date   WBC 9.0 09/05/2013   HGB 13.0 09/05/2013   HCT 39.2 09/05/2013   MCV 85.6 09/05/2013   PLT 570 (H) 09/05/2013   No results for input(s): NA, K, CL, CO2, GLUCOSE, BUN, CREATININE, CALCIUM, GFRNONAA, GFRAA, PROT, ALBUMIN, AST, ALT, ALKPHOS, BILITOT, BILIDIR, IBILI in the last 8760 hours.   ASSESSMENT & PLAN:  Thrombocytosis ([place]) This is likely related to undiagnosed myeloproliferative disorder. We discussed the risk and benefit of ordering additional workup to exclude essential thrombocytosis. Previously, she had JAK2 mutation study ordered but there are other second generation sequencing studies such as MPL and CAL-R that would be helpful instead of going for bone marrow aspirate and biopsy. We discussed extensively and ultimately, the patient would like to hold off ordering additional workup. She is comfortable with follow up appointment with her primary care doctor. I recommend she stays on aspirin therapy. She is in agreement to return here if her platelet count is over 600,000   Essential hypertension, benign she will continue current medical management. I recommend close follow-up with primary care doctor for medication adjustment.   Hormone replacement therapy, postmenopausal The patient is on long-term hormone replacement therapy. She would be at risk of  thrombosis. I recommend she wean off HRT in the near future under the direction of her gynecologist   No orders of the defined types were placed in this encounter.   All questions were answered. The patient knows to call the clinic with any problems, questions or concerns. I spent 25 minutes counseling the patient face to face. The total time spent in the appointment was 40 minutes and more than 50% was on counseling.     [facility], [place], MD 01/10/2016 12:15 PM

## 2016-02-04 DIAGNOSIS — G4733 Obstructive sleep apnea (adult) (pediatric): Secondary | ICD-10-CM | POA: Diagnosis not present

## 2016-03-18 DIAGNOSIS — Z23 Encounter for immunization: Secondary | ICD-10-CM | POA: Diagnosis not present

## 2016-05-08 ENCOUNTER — Other Ambulatory Visit: Payer: Self-pay | Admitting: Obstetrics & Gynecology

## 2016-05-08 DIAGNOSIS — Z1231 Encounter for screening mammogram for malignant neoplasm of breast: Secondary | ICD-10-CM

## 2016-05-31 ENCOUNTER — Ambulatory Visit
Admission: RE | Admit: 2016-05-31 | Discharge: 2016-05-31 | Disposition: A | Discharge status: Home / Self Care | Payer: BLUE CROSS/BLUE SHIELD | Source: Ambulatory Visit | Attending: Obstetrics & Gynecology | Admitting: Obstetrics & Gynecology

## 2016-05-31 DIAGNOSIS — Z1231 Encounter for screening mammogram for malignant neoplasm of breast: Secondary | ICD-10-CM

## 2016-09-06 ENCOUNTER — Other Ambulatory Visit: Payer: Self-pay | Admitting: Certified Nurse Midwife

## 2016-09-06 NOTE — Telephone Encounter (Signed)
Patient called and requested refills on generic Prozac to last until her next AEX on 10/27/16. She said she called the pharmacy to request the refill but they told her it was denied.  Pharmacy on file is correct.

## 2016-09-06 NOTE — Telephone Encounter (Signed)
Medication refill request: Fluoxetine Last AEX:  10/22/15 DL Next AEX: 10/27/16 DL Last MMG (if hormonal medication request): 05/31/16 BIRADS1, Density B, TBC Refill authorized: 09/07/15 #90 1R. Patient would like a refill until AEX on 10/27/16.

## 2016-09-08 NOTE — Telephone Encounter (Signed)
Left message to call Markian Glockner at [phone].  

## 2016-09-08 NOTE — Telephone Encounter (Signed)
Please call patient and advise her PCP is managing her Hypertension and medication and in reviewing her records she had seen hematology after her aex here last year and was recommended to discontinue her HRT, due to risk of thrombosis. She needs an appointment to wean off and will not ok any further refills. If she has stopped the HRT,appointment not needed.

## 2016-09-11 ENCOUNTER — Other Ambulatory Visit: Payer: Self-pay | Admitting: Obstetrics & Gynecology

## 2016-09-11 MED ORDER — FLUOXETINE HCL 20 MG PO CAPS: 20.0000 mg | ORAL_CAPSULE | Freq: Every day | ORAL | 1 refills | Status: DC

## 2016-09-11 NOTE — Telephone Encounter (Signed)
Spoke with patient. Advised patient of message as seen below from Melvia Heaps CNM regarding HRT. Patient states that after her appointment with hematology she has been cutting her Estradiol 1 mg in half and her Provera 2.5 mg in half. Has been feeling well. Asking for advice on how to wean further before stopping. Patient is also asking for refill on Fluoxetine. States her PCP will not fill this as our off started her on this medication. "You have to refill this. I cannot be at work crying all day." Advised will review with provider and return call with further recommendations. Patient is agreeable.  Routing to [place] for review as Melvia Heaps CNM is out of the office today.

## 2016-10-27 ENCOUNTER — Ambulatory Visit (INDEPENDENT_AMBULATORY_CARE_PROVIDER_SITE_OTHER): Payer: BLUE CROSS/BLUE SHIELD | Admitting: Certified Nurse Midwife

## 2016-10-27 ENCOUNTER — Encounter: Payer: Self-pay | Admitting: Certified Nurse Midwife

## 2016-10-27 VITALS — BP 112/70 | HR 74 | Resp 16 | Ht 67.75 in | Wt 282.0 lb

## 2016-10-27 DIAGNOSIS — N951 Menopausal and female climacteric states: Secondary | ICD-10-CM | POA: Diagnosis not present

## 2016-10-27 DIAGNOSIS — Z01419 Encounter for gynecological examination (general) (routine) without abnormal findings: Secondary | ICD-10-CM

## 2016-10-27 MED ORDER — MEDROXYPROGESTERONE ACETATE 2.5 MG PO TABS: 2.5000 mg | ORAL_TABLET | Freq: Every day | ORAL | 2 refills | Status: AC

## 2016-10-27 MED ORDER — ESTRADIOL 0.5 MG PO TABS: ORAL_TABLET | ORAL | 11 refills | Status: DC

## 2016-10-27 NOTE — Progress Notes (Signed)
56 y.o. G0P0000 Single  Caucasian Fe here for annual exam.Menopausal on HRT. Denies vaginal bleeding or vaginal dryness. Patient decided she needed to come off HRT and stopped both. After 3-4 days hot flashes were increased and restarted. Would like to cut the dose and has been taking 1/2 tablet of 1 mg Estrace and Progesterone 2.5 mg. Feels much better now. Will plan to stay on at this point and may want another Rx to help with decrease. Sees PCP for hypertension and anxiety management/labs and aex. No health issues today.  Patient's last menstrual period was 01/20/2006.          Sexually active: No.  The current method of family planning is status post hysterectomy.    Exercising: Yes.    treadmill, weights Smoker:  no  Health Maintenance: Pap:  10-27-05 neg History of Abnormal Pap: no MMG:  05-31-16 category b density birads 1:neg Self Breast exams: occ Colonoscopy:  none BMD:   none TDaP:  2014 Shingles: no Pneumonia: no Hep C and HIV: donated blood in the past Labs: none   reports that she has quit smoking. She has never used smokeless tobacco. She reports that she drinks alcohol. She reports that she does not use drugs.  Past Medical History:  Diagnosis Date  . Adenomyosis   . Depression    mild  . Endometriosis   . Fibroid   . Hypertension   . Sleep apnea   . Thrombocytosis ([place])     Past Surgical History:  Procedure Laterality Date  . BUNIONECTOMY    . INNER EAR SURGERY Left    hole in eardrum  . TOTAL ABDOMINAL HYSTERECTOMY  9/07    Current Outpatient Prescriptions  Medication Sig Dispense Refill  . estradiol (ESTRACE) 1 MG tablet Take 1 tablet (1 mg total) by mouth daily. (Patient taking differently: Take 1 mg by mouth daily. Takes 1/2) 90 tablet 4  . FLUoxetine (PROZAC) 20 MG capsule Take 1 capsule (20 mg total) by mouth daily. 90 capsule 1  . hydrochlorothiazide (HYDRODIURIL) 25 MG tablet Take 25 mg by mouth daily.    Marland Kitchen losartan (COZAAR) 25 MG tablet Take 25  mg by mouth daily.    . medroxyPROGESTERone (PROVERA) 2.5 MG tablet Take 1 tablet (2.5 mg total) by mouth daily. 90 tablet 4   No current facility-administered medications for this visit.     Family History  Problem Relation Age of Onset  . Hypertension Mother   . Stroke Mother   . Breast cancer Maternal Aunt   . Breast cancer Maternal Grandmother   . Breast cancer Maternal Aunt     ROS:  Pertinent items are noted in HPI.  Otherwise, a comprehensive ROS was negative.  Exam:   BP 112/70   Pulse 74   Resp 16   Ht 5' 7.75" (1.721 m)   Wt 282 lb (127.9 kg)   LMP 01/20/2006   BMI 43.19 kg/m  Height: 5' 7.75" (172.1 cm) Ht Readings from Last 3 Encounters:  10/27/16 5' 7.75" (1.721 m)  01/10/16 5' 7.75" (1.721 m)  10/22/15 5' 7.75" (1.721 m)    General appearance: alert, cooperative and appears stated age Head: Normocephalic, without obvious abnormality, atraumatic Neck: no adenopathy, supple, symmetrical, trachea midline and thyroid normal to inspection and palpation Lungs: clear to auscultation bilaterally Breasts: normal appearance, no masses or tenderness, No nipple retraction or dimpling, No nipple discharge or bleeding, No axillary or supraclavicular adenopathy Heart: regular rate and rhythm Abdomen: soft, non-tender;  no masses,  no organomegaly Extremities: extremities normal, atraumatic, no cyanosis or edema Skin: Skin color, texture, turgor normal. No rashes or lesions Lymph nodes: Cervical, supraclavicular, and axillary nodes normal. No abnormal inguinal nodes palpated Neurologic: Grossly normal   Pelvic: External genitalia:  no lesions              Urethra:  normal appearing urethra with no masses, tenderness or lesions              Bartholin's and Skene's: normal                 Vagina: normal appearing vagina with normal color and discharge, no lesions              Cervix: absent              Pap taken: No. Bimanual Exam:  Uterus:  uterus absent               Adnexa: normal adnexa and no mass, fullness, tenderness               Rectovaginal: Confirms               Anus:  normal sphincter tone, no lesions  Chaperone present: yes  A:  Well Woman with normal exam  Menopausal on HRT request decrease dose and will advise if needs to change  S/P TAH for bleeding  Hypertension/anxiety with PCP management  P:   Reviewed health and wellness pertinent to exam  Rx Estrace see order with instructions  Rx Progesterone see order with instructions  Continue PCP follow up as indicated  Pap smear: no   counseled on breast self exam, mammography screening, use and side effects of HRT, adequate intake of calcium and vitamin D, diet and exercise  return annually or prn  An After Visit Summary was printed and given to the patient.

## 2016-10-27 NOTE — Patient Instructions (Signed)

## 2016-11-28 ENCOUNTER — Other Ambulatory Visit: Payer: Self-pay | Admitting: Certified Nurse Midwife

## 2016-11-28 DIAGNOSIS — N951 Menopausal and female climacteric states: Secondary | ICD-10-CM

## 2016-12-03 ENCOUNTER — Other Ambulatory Visit: Payer: Self-pay | Admitting: Certified Nurse Midwife

## 2016-12-03 DIAGNOSIS — N951 Menopausal and female climacteric states: Secondary | ICD-10-CM

## 2016-12-04 NOTE — Telephone Encounter (Signed)
eScribe request from [facility] for refill on MEDROXYPROGESTERONE Last filled - 10/27/16, #90 X 2 Last AEX - 10/27/16 Next AEX - 11/02/17 Last MMG (if homonal medicaiton) - 05/31/16, Bi-Rads 1:  Negative, [facility]  RX denied today as new RX sent at annual exam appointment on 10/27/16. Closing encounter.

## 2016-12-06 ENCOUNTER — Telehealth: Payer: Self-pay | Admitting: Certified Nurse Midwife

## 2016-12-06 DIAGNOSIS — N951 Menopausal and female climacteric states: Secondary | ICD-10-CM

## 2016-12-06 MED ORDER — ESTRADIOL 0.5 MG PO TABS: ORAL_TABLET | ORAL | 11 refills | Status: AC

## 2016-12-06 NOTE — Telephone Encounter (Signed)
Prescription sent to pharmacy per French Ana, CNM.  Closing encounter.

## 2016-12-06 NOTE — Telephone Encounter (Signed)
0.5mg  1/2 half tablet daily

## 2016-12-06 NOTE — Telephone Encounter (Signed)
Faxed request from [facility] to resend Estrace prescription as they did not receive refill that was sent on 10/27/16.   I called and spoke with [facility] at [facility] and he states they did receive medroxyprogesterone on that day, but have no record of Estrace. When attempting to give verbal order per chart, new RX in chart is different than requested medication.  Please advise if new RX should be 0.5mg  1/2 tablet or 1 mg 1/2 tablet daily. Thank you.

## 2017-01-25 DIAGNOSIS — Z13220 Encounter for screening for lipoid disorders: Secondary | ICD-10-CM | POA: Diagnosis not present

## 2017-01-25 DIAGNOSIS — Z1211 Encounter for screening for malignant neoplasm of colon: Secondary | ICD-10-CM | POA: Diagnosis not present

## 2017-01-25 DIAGNOSIS — I10 Essential (primary) hypertension: Secondary | ICD-10-CM | POA: Diagnosis not present

## 2017-01-26 DIAGNOSIS — Z23 Encounter for immunization: Secondary | ICD-10-CM | POA: Diagnosis not present

## 2017-02-15 DIAGNOSIS — Z1211 Encounter for screening for malignant neoplasm of colon: Secondary | ICD-10-CM | POA: Diagnosis not present

## 2017-02-15 DIAGNOSIS — Z01818 Encounter for other preprocedural examination: Secondary | ICD-10-CM | POA: Diagnosis not present

## 2017-02-28 ENCOUNTER — Other Ambulatory Visit: Payer: Self-pay | Admitting: Obstetrics & Gynecology

## 2017-02-28 NOTE — Telephone Encounter (Signed)
Medication refill request: Prozac  Last AEX:  10-27-16  Next AEX: 11-02-17  Last MMG (if hormonal medication request): 05-31-16 WNL  Refill authorized: please advise

## 2017-03-01 ENCOUNTER — Telehealth: Payer: Self-pay | Admitting: Certified Nurse Midwife

## 2017-03-01 NOTE — Telephone Encounter (Signed)
agree

## 2017-03-01 NOTE — Telephone Encounter (Signed)
Spoke with patient. Advised no further recommendations, 1/2 of 0.5mg  estradiol tablet daily. Patient is agreeable and verbalizes understanding. Will close encounter.

## 2017-03-01 NOTE — Telephone Encounter (Signed)
Patient has some questions about her prescription for Estradiol.

## 2017-03-01 NOTE — Telephone Encounter (Signed)
Spoke with patient. Patient states at last AEX weaning down estradiol was discussed. Patient calling to clarify instructions.   Patient currently taking 1/2 of 1mg  tab daily.  Advised patient on 12/06/16 new Rx sent to pharmacy for estradiol 0.5mg  tab, take 1/2 tablet daily (0.25mg ).   Patient states she does not believe she has picked that new RX up yet, discussed cutting down to 1/3 tablet, does not remember reducing to 1/2.  Advised patient if she is currently taking 0.5mg  daily, per previous recommendations, may cut to 0.25mg  daily. Advised will review with Melvia Heaps, CNM and return call with any additional recommendations. Patient verbalizes understanding and is agreeable.   Melvia Heaps, CNM -any additional recommendations?

## 2017-03-28 DIAGNOSIS — G4733 Obstructive sleep apnea (adult) (pediatric): Secondary | ICD-10-CM | POA: Diagnosis not present

## 2017-08-27 DIAGNOSIS — H8110 Benign paroxysmal vertigo, unspecified ear: Secondary | ICD-10-CM | POA: Diagnosis not present

## 2017-09-06 ENCOUNTER — Other Ambulatory Visit: Payer: Self-pay | Admitting: Family Medicine

## 2017-09-06 DIAGNOSIS — Z1231 Encounter for screening mammogram for malignant neoplasm of breast: Secondary | ICD-10-CM

## 2017-09-13 DIAGNOSIS — Z0000 Encounter for general adult medical examination without abnormal findings: Secondary | ICD-10-CM | POA: Diagnosis not present

## 2017-09-13 DIAGNOSIS — F418 Other specified anxiety disorders: Secondary | ICD-10-CM | POA: Diagnosis not present

## 2017-09-13 DIAGNOSIS — Z1211 Encounter for screening for malignant neoplasm of colon: Secondary | ICD-10-CM | POA: Diagnosis not present

## 2017-09-13 DIAGNOSIS — I10 Essential (primary) hypertension: Secondary | ICD-10-CM | POA: Diagnosis not present

## 2017-09-13 DIAGNOSIS — E7800 Pure hypercholesterolemia, unspecified: Secondary | ICD-10-CM | POA: Diagnosis not present

## 2017-10-01 ENCOUNTER — Ambulatory Visit
Admission: RE | Admit: 2017-10-01 | Discharge: 2017-10-01 | Disposition: A | Discharge status: Home / Self Care | Payer: BLUE CROSS/BLUE SHIELD | Source: Ambulatory Visit | Attending: Family Medicine | Admitting: Family Medicine

## 2017-10-01 DIAGNOSIS — Z1231 Encounter for screening mammogram for malignant neoplasm of breast: Secondary | ICD-10-CM

## 2017-10-09 DIAGNOSIS — G4733 Obstructive sleep apnea (adult) (pediatric): Secondary | ICD-10-CM | POA: Diagnosis not present

## 2017-10-30 DIAGNOSIS — L918 Other hypertrophic disorders of the skin: Secondary | ICD-10-CM | POA: Diagnosis not present

## 2017-11-02 ENCOUNTER — Ambulatory Visit: Payer: BLUE CROSS/BLUE SHIELD | Admitting: Certified Nurse Midwife

## 2017-11-26 DIAGNOSIS — K644 Residual hemorrhoidal skin tags: Secondary | ICD-10-CM | POA: Diagnosis not present

## 2017-11-26 DIAGNOSIS — K635 Polyp of colon: Secondary | ICD-10-CM | POA: Diagnosis not present

## 2017-11-26 DIAGNOSIS — Z1211 Encounter for screening for malignant neoplasm of colon: Secondary | ICD-10-CM | POA: Diagnosis not present

## 2017-11-26 DIAGNOSIS — D126 Benign neoplasm of colon, unspecified: Secondary | ICD-10-CM | POA: Diagnosis not present

## 2017-11-26 DIAGNOSIS — K5730 Diverticulosis of large intestine without perforation or abscess without bleeding: Secondary | ICD-10-CM | POA: Diagnosis not present

## 2017-11-28 DIAGNOSIS — K635 Polyp of colon: Secondary | ICD-10-CM | POA: Diagnosis not present

## 2017-11-28 DIAGNOSIS — Z1211 Encounter for screening for malignant neoplasm of colon: Secondary | ICD-10-CM | POA: Diagnosis not present

## 2017-11-28 DIAGNOSIS — D126 Benign neoplasm of colon, unspecified: Secondary | ICD-10-CM | POA: Diagnosis not present

## 2018-02-08 DIAGNOSIS — G4733 Obstructive sleep apnea (adult) (pediatric): Secondary | ICD-10-CM | POA: Diagnosis not present

## 2018-02-16 IMAGING — MG DIGITAL SCREENING BILATERAL MAMMOGRAM WITH CAD
4 series · 4 of 4 positions shown · non-contrast
Comparison: Previous exam(s).

CLINICAL DATA: Screening.

EXAM:
DIGITAL SCREENING BILATERAL MAMMOGRAM WITH CAD

[L MLO]
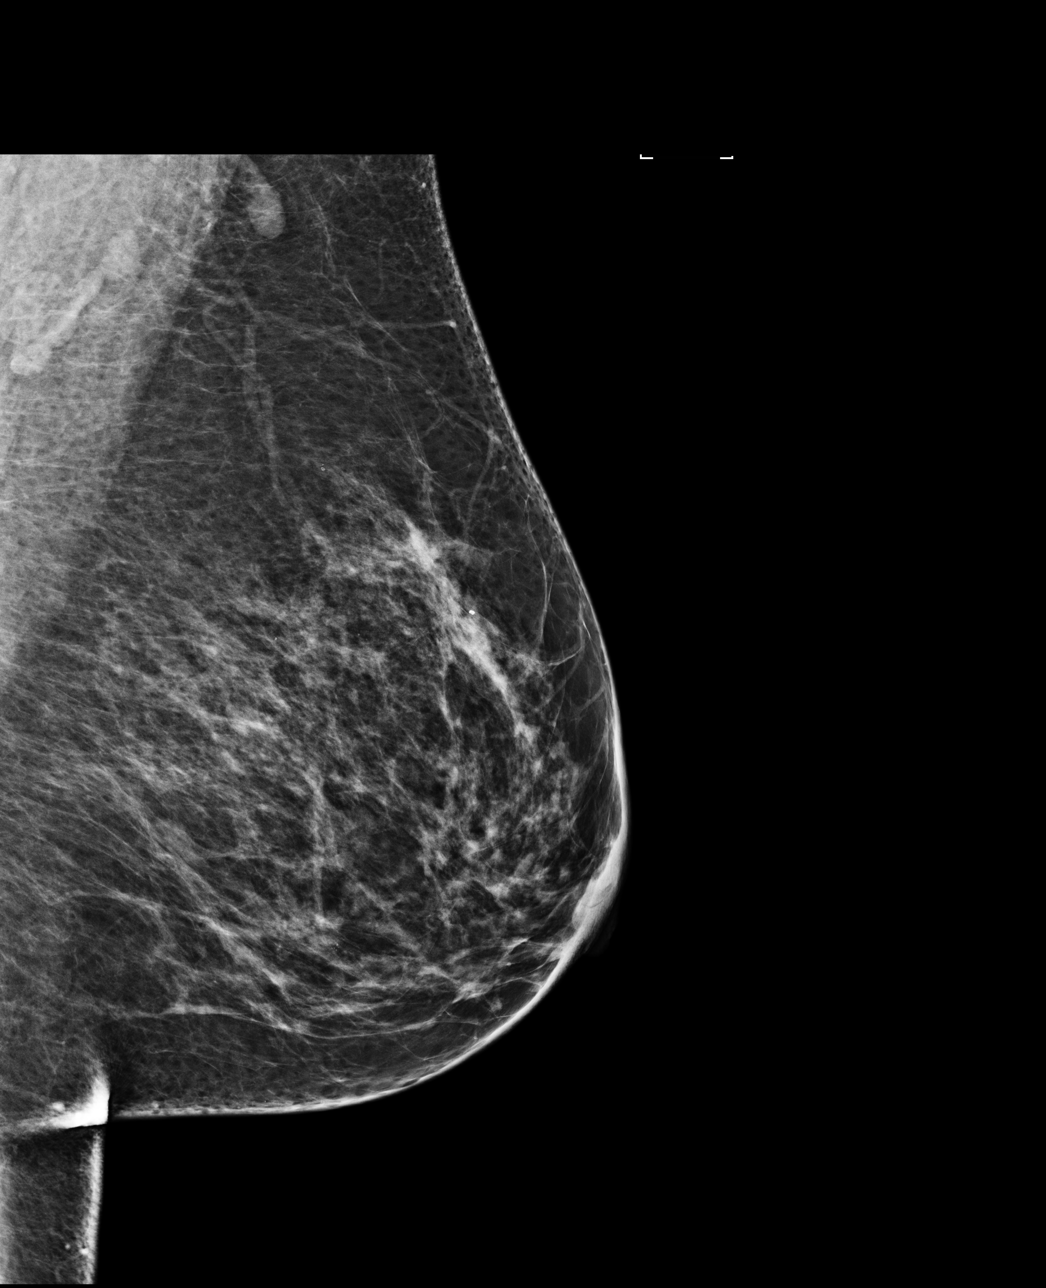

[R MLO]
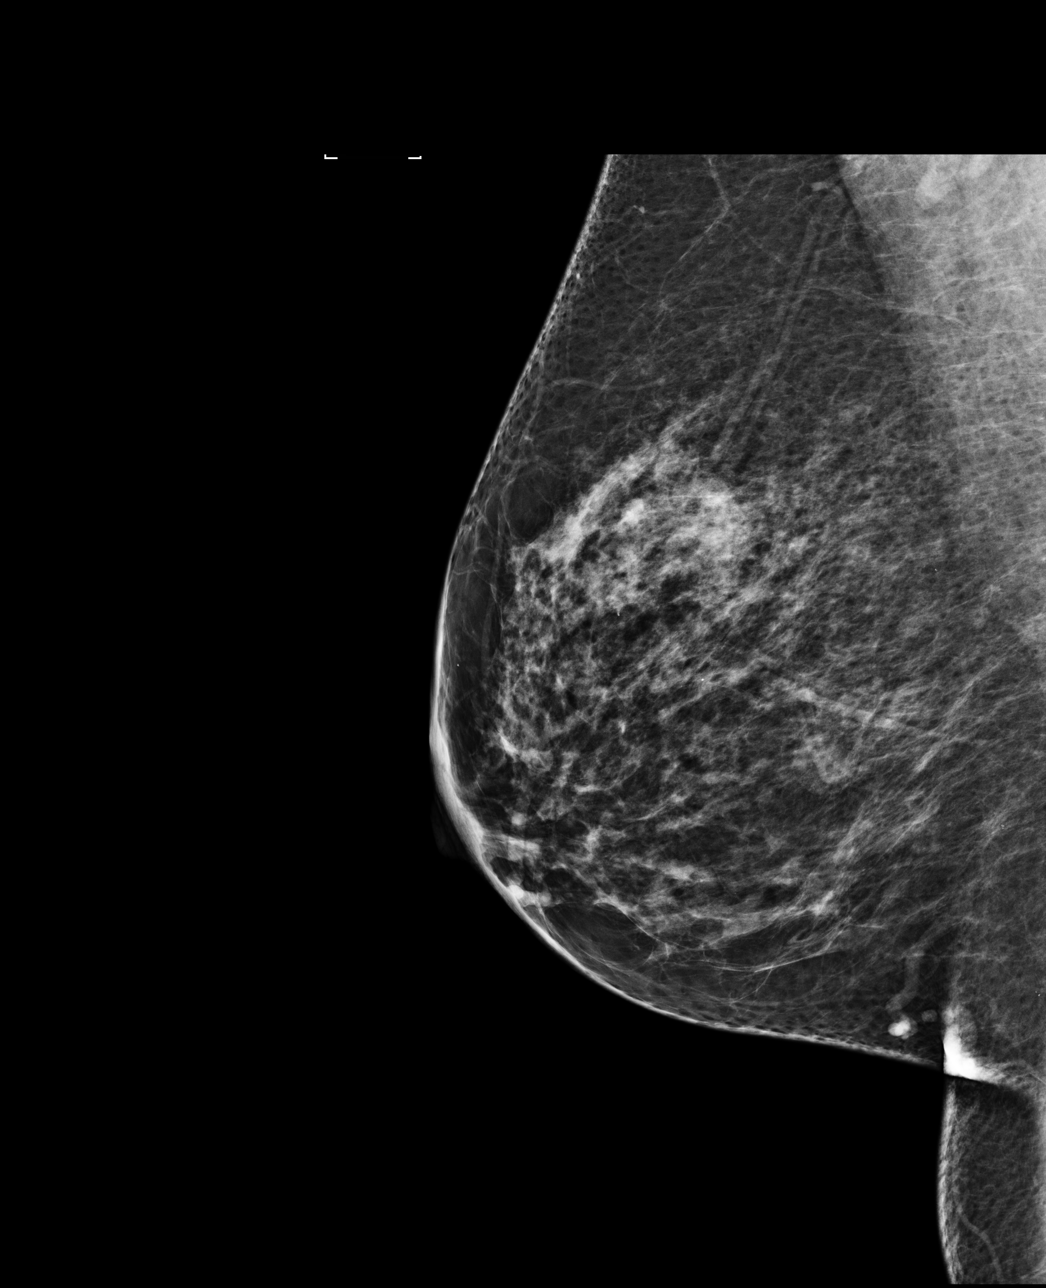

[L CC]
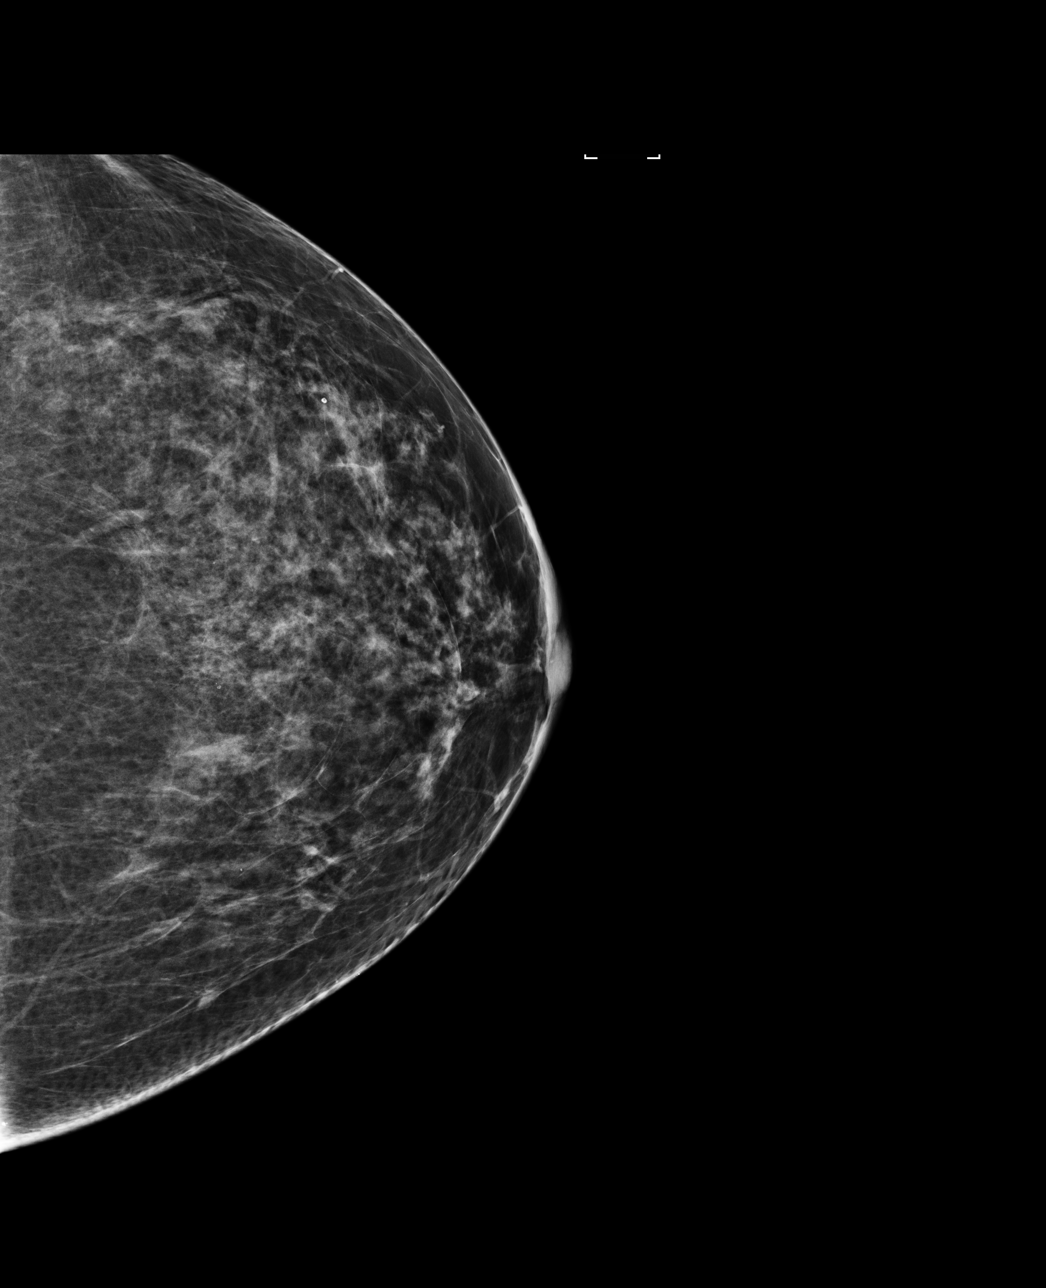

[R CC]
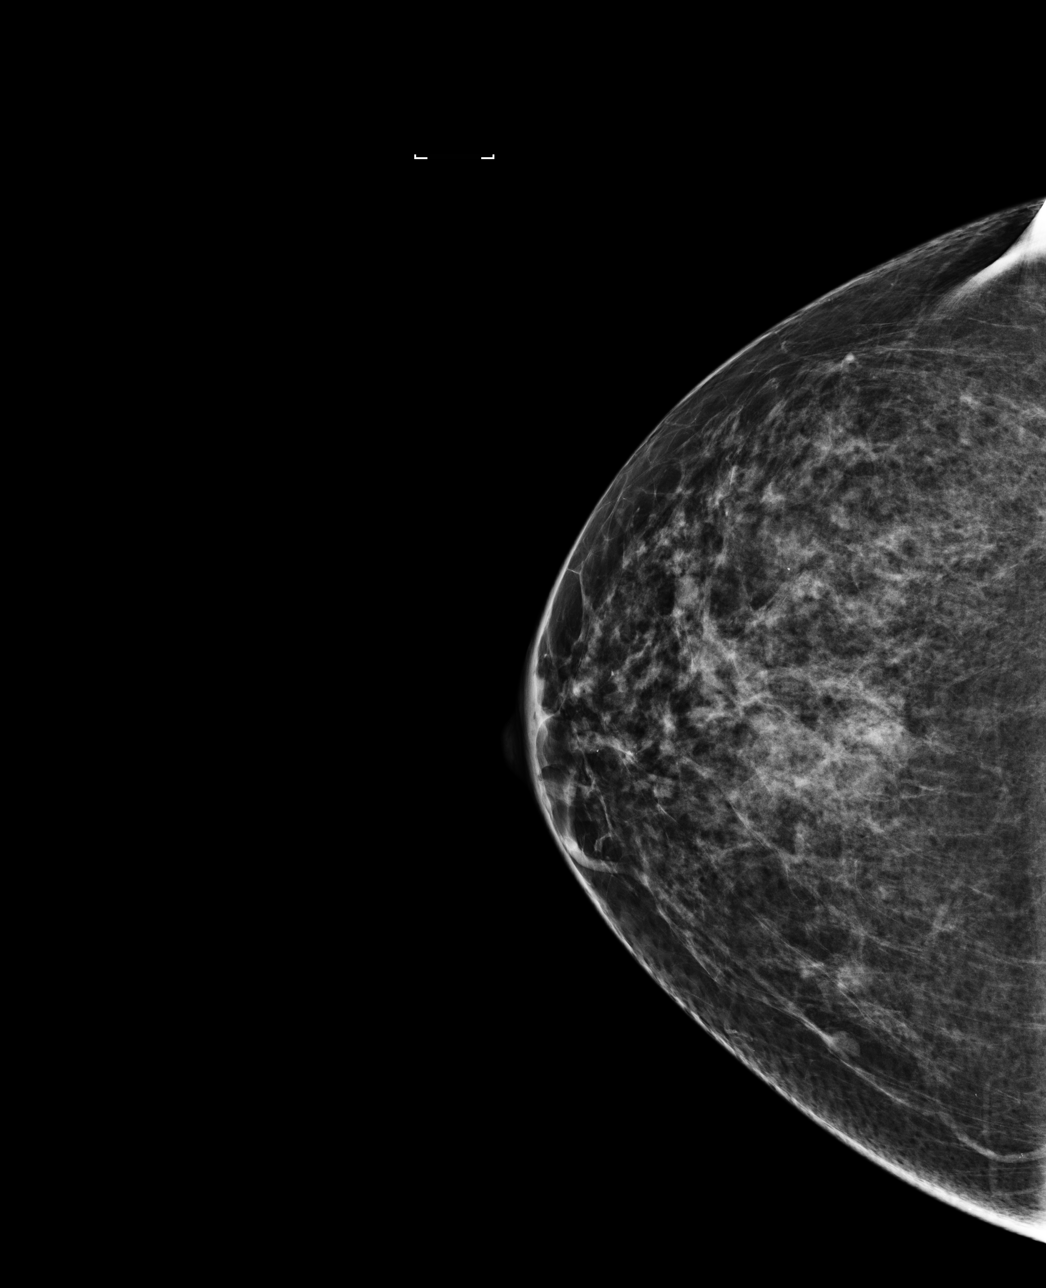

[4 of 4 positions shown; findings below may reference images not displayed]

ACR Breast Density Category b: There are scattered areas of
fibroglandular density.
FINDINGS: There are no findings suspicious for malignancy. Images were
processed with CAD.
IMPRESSION: No mammographic evidence of malignancy. A result letter of this
screening mammogram will be mailed directly to the patient.

RECOMMENDATION:
Screening mammogram in one year. (Code:AS-G-LCT)

BI-RADS CATEGORY  1: Negative.

## 2018-03-25 DIAGNOSIS — E7800 Pure hypercholesterolemia, unspecified: Secondary | ICD-10-CM | POA: Diagnosis not present

## 2018-03-25 DIAGNOSIS — I10 Essential (primary) hypertension: Secondary | ICD-10-CM | POA: Diagnosis not present

## 2018-03-25 DIAGNOSIS — G4733 Obstructive sleep apnea (adult) (pediatric): Secondary | ICD-10-CM | POA: Diagnosis not present

## 2018-03-25 DIAGNOSIS — D473 Essential (hemorrhagic) thrombocythemia: Secondary | ICD-10-CM | POA: Diagnosis not present

## 2018-03-25 DIAGNOSIS — F418 Other specified anxiety disorders: Secondary | ICD-10-CM | POA: Diagnosis not present

## 2018-04-09 DIAGNOSIS — G4733 Obstructive sleep apnea (adult) (pediatric): Secondary | ICD-10-CM | POA: Diagnosis not present

## 2018-04-24 DIAGNOSIS — G4733 Obstructive sleep apnea (adult) (pediatric): Secondary | ICD-10-CM | POA: Diagnosis not present

## 2018-05-09 DIAGNOSIS — G4733 Obstructive sleep apnea (adult) (pediatric): Secondary | ICD-10-CM | POA: Diagnosis not present

## 2018-07-19 DIAGNOSIS — Z189 Retained foreign body fragments, unspecified material: Secondary | ICD-10-CM | POA: Diagnosis not present

## 2019-04-23 DIAGNOSIS — G4733 Obstructive sleep apnea (adult) (pediatric): Secondary | ICD-10-CM | POA: Diagnosis not present

## 2019-08-11 ENCOUNTER — Encounter: Payer: Self-pay | Admitting: Certified Nurse Midwife

## 2020-04-22 ENCOUNTER — Other Ambulatory Visit: Payer: Self-pay | Admitting: Family Medicine

## 2020-04-22 DIAGNOSIS — Z1231 Encounter for screening mammogram for malignant neoplasm of breast: Secondary | ICD-10-CM

## 2020-05-20 DIAGNOSIS — Z1231 Encounter for screening mammogram for malignant neoplasm of breast: Secondary | ICD-10-CM

## 2020-05-27 ENCOUNTER — Other Ambulatory Visit: Payer: Self-pay

## 2020-05-27 ENCOUNTER — Ambulatory Visit
Admission: RE | Admit: 2020-05-27 | Discharge: 2020-05-27 | Disposition: A | Discharge status: Home / Self Care | Payer: 59 | Source: Ambulatory Visit | Attending: Family Medicine | Admitting: Family Medicine

## 2020-05-27 DIAGNOSIS — Z1231 Encounter for screening mammogram for malignant neoplasm of breast: Secondary | ICD-10-CM

## 2021-10-26 ENCOUNTER — Other Ambulatory Visit: Payer: Self-pay | Admitting: Family Medicine

## 2021-10-26 DIAGNOSIS — Z1231 Encounter for screening mammogram for malignant neoplasm of breast: Secondary | ICD-10-CM

## 2021-11-03 ENCOUNTER — Ambulatory Visit: Payer: Self-pay

## 2021-11-17 ENCOUNTER — Ambulatory Visit: Payer: Self-pay

## 2021-12-01 ENCOUNTER — Ambulatory Visit
Admission: RE | Admit: 2021-12-01 | Discharge: 2021-12-01 | Disposition: A | Discharge status: Home / Self Care | Payer: Commercial Managed Care - HMO | Source: Ambulatory Visit | Attending: Family Medicine | Admitting: Family Medicine

## 2021-12-01 DIAGNOSIS — Z1231 Encounter for screening mammogram for malignant neoplasm of breast: Secondary | ICD-10-CM

## 2023-03-19 ENCOUNTER — Other Ambulatory Visit: Payer: Self-pay | Admitting: Family Medicine

## 2023-03-19 DIAGNOSIS — Z1231 Encounter for screening mammogram for malignant neoplasm of breast: Secondary | ICD-10-CM

## 2023-03-22 ENCOUNTER — Ambulatory Visit
Admission: RE | Admit: 2023-03-22 | Discharge: 2023-03-22 | Disposition: A | Discharge status: Home / Self Care | Payer: 59 | Source: Ambulatory Visit | Attending: Family Medicine | Admitting: Family Medicine

## 2023-03-22 DIAGNOSIS — Z1231 Encounter for screening mammogram for malignant neoplasm of breast: Secondary | ICD-10-CM
